# Patient Record
Sex: Female | Born: 1970 | Race: White | Hispanic: No | Marital: Married | Smoking: Never smoker
Health system: Southern US, Community
[De-identification: ages and names within clinical notes are randomized; demographics above are authoritative.]

## PROBLEM LIST (undated history)

## (undated) DIAGNOSIS — K635 Polyp of colon: Secondary | ICD-10-CM

## (undated) DIAGNOSIS — G8929 Other chronic pain: Secondary | ICD-10-CM

## (undated) DIAGNOSIS — K222 Esophageal obstruction: Secondary | ICD-10-CM

## (undated) DIAGNOSIS — M545 Low back pain, unspecified: Secondary | ICD-10-CM

## (undated) DIAGNOSIS — K219 Gastro-esophageal reflux disease without esophagitis: Secondary | ICD-10-CM

## (undated) DIAGNOSIS — N809 Endometriosis, unspecified: Secondary | ICD-10-CM

## (undated) DIAGNOSIS — R112 Nausea with vomiting, unspecified: Secondary | ICD-10-CM

## (undated) DIAGNOSIS — F419 Anxiety disorder, unspecified: Secondary | ICD-10-CM

## (undated) DIAGNOSIS — Z9889 Other specified postprocedural states: Secondary | ICD-10-CM

## (undated) HISTORY — PX: DIAGNOSTIC LAPAROSCOPY: SUR761

## (undated) HISTORY — DX: Esophageal obstruction: K22.2

## (undated) HISTORY — PX: DILATION AND CURETTAGE OF UTERUS: SHX78

## (undated) HISTORY — DX: Endometriosis, unspecified: N80.9

## (undated) HISTORY — DX: Polyp of colon: K63.5

## (undated) HISTORY — PX: APPENDECTOMY: SHX54

---

## 1997-07-20 ENCOUNTER — Ambulatory Visit (HOSPITAL_COMMUNITY): Admission: RE | Admit: 1997-07-20 | Discharge: 1997-07-20 | Payer: Self-pay | Admitting: Family Medicine

## 1997-07-21 ENCOUNTER — Ambulatory Visit (HOSPITAL_COMMUNITY): Admission: AD | Admit: 1997-07-21 | Discharge: 1997-07-21 | Payer: Self-pay | Admitting: Obstetrics and Gynecology

## 1997-10-27 ENCOUNTER — Other Ambulatory Visit: Admission: RE | Admit: 1997-10-27 | Discharge: 1997-10-27 | Payer: Self-pay | Admitting: Obstetrics and Gynecology

## 1998-05-18 ENCOUNTER — Ambulatory Visit: Admission: RE | Admit: 1998-05-18 | Discharge: 1998-05-18 | Payer: Self-pay | Admitting: Obstetrics and Gynecology

## 1998-06-28 ENCOUNTER — Inpatient Hospital Stay (HOSPITAL_COMMUNITY): Admission: AD | Admit: 1998-06-28 | Discharge: 1998-06-30 | Payer: Self-pay | Admitting: Obstetrics and Gynecology

## 1998-07-31 ENCOUNTER — Encounter (HOSPITAL_COMMUNITY): Admission: RE | Admit: 1998-07-31 | Discharge: 1998-10-29 | Payer: Self-pay | Admitting: Obstetrics and Gynecology

## 1998-08-01 ENCOUNTER — Other Ambulatory Visit: Admission: RE | Admit: 1998-08-01 | Discharge: 1998-08-01 | Payer: Self-pay | Admitting: Obstetrics & Gynecology

## 1999-06-21 ENCOUNTER — Other Ambulatory Visit: Admission: RE | Admit: 1999-06-21 | Discharge: 1999-06-21 | Payer: Self-pay | Admitting: Obstetrics and Gynecology

## 2000-08-19 ENCOUNTER — Other Ambulatory Visit: Admission: RE | Admit: 2000-08-19 | Discharge: 2000-08-19 | Payer: Self-pay | Admitting: Obstetrics and Gynecology

## 2001-07-30 ENCOUNTER — Inpatient Hospital Stay (HOSPITAL_COMMUNITY): Admission: AD | Admit: 2001-07-30 | Discharge: 2001-08-02 | Payer: Self-pay | Admitting: Obstetrics and Gynecology

## 2001-08-03 ENCOUNTER — Encounter: Admission: RE | Admit: 2001-08-03 | Discharge: 2001-09-02 | Payer: Self-pay | Admitting: Obstetrics and Gynecology

## 2001-09-08 ENCOUNTER — Other Ambulatory Visit: Admission: RE | Admit: 2001-09-08 | Discharge: 2001-09-08 | Payer: Self-pay | Admitting: Obstetrics & Gynecology

## 2001-10-03 ENCOUNTER — Encounter: Admission: RE | Admit: 2001-10-03 | Discharge: 2001-11-02 | Payer: Self-pay | Admitting: Obstetrics and Gynecology

## 2002-04-08 HISTORY — PX: BLADDER SUSPENSION: SHX72

## 2002-10-04 ENCOUNTER — Other Ambulatory Visit: Admission: RE | Admit: 2002-10-04 | Discharge: 2002-10-04 | Payer: Self-pay | Admitting: Obstetrics and Gynecology

## 2002-11-04 ENCOUNTER — Ambulatory Visit (HOSPITAL_BASED_OUTPATIENT_CLINIC_OR_DEPARTMENT_OTHER): Admission: RE | Admit: 2002-11-04 | Discharge: 2002-11-04 | Payer: Self-pay | Admitting: Urology

## 2003-08-18 ENCOUNTER — Other Ambulatory Visit: Admission: RE | Admit: 2003-08-18 | Discharge: 2003-08-18 | Payer: Self-pay | Admitting: Obstetrics and Gynecology

## 2004-08-21 ENCOUNTER — Other Ambulatory Visit: Admission: RE | Admit: 2004-08-21 | Discharge: 2004-08-21 | Payer: Self-pay | Admitting: Obstetrics and Gynecology

## 2010-04-08 HISTORY — PX: URETHRAL SLING: SHX2621

## 2010-10-09 ENCOUNTER — Other Ambulatory Visit (HOSPITAL_COMMUNITY): Payer: Self-pay | Admitting: Urology

## 2010-10-09 DIAGNOSIS — N361 Urethral diverticulum: Secondary | ICD-10-CM

## 2010-10-11 ENCOUNTER — Ambulatory Visit (HOSPITAL_COMMUNITY)
Admission: RE | Admit: 2010-10-11 | Discharge: 2010-10-11 | Disposition: A | Discharge status: Home / Self Care | Payer: PRIVATE HEALTH INSURANCE | Source: Ambulatory Visit | Attending: Urology | Admitting: Urology

## 2010-10-11 DIAGNOSIS — N361 Urethral diverticulum: Secondary | ICD-10-CM

## 2010-10-11 DIAGNOSIS — R3915 Urgency of urination: Secondary | ICD-10-CM | POA: Insufficient documentation

## 2010-10-11 DIAGNOSIS — N72 Inflammatory disease of cervix uteri: Secondary | ICD-10-CM | POA: Insufficient documentation

## 2010-10-11 DIAGNOSIS — N949 Unspecified condition associated with female genital organs and menstrual cycle: Secondary | ICD-10-CM | POA: Insufficient documentation

## 2010-10-11 DIAGNOSIS — N854 Malposition of uterus: Secondary | ICD-10-CM | POA: Insufficient documentation

## 2010-10-11 DIAGNOSIS — M5126 Other intervertebral disc displacement, lumbar region: Secondary | ICD-10-CM | POA: Insufficient documentation

## 2010-10-11 MED ORDER — GADOBENATE DIMEGLUMINE 529 MG/ML IV SOLN
14.0000 mL | Freq: Once | INTRAVENOUS | Status: AC | PRN
  Administered 2010-10-11: 14 mL via INTRAVENOUS

## 2010-12-21 ENCOUNTER — Ambulatory Visit (HOSPITAL_BASED_OUTPATIENT_CLINIC_OR_DEPARTMENT_OTHER)
Admission: RE | Admit: 2010-12-21 | Discharge: 2010-12-21 | Disposition: A | Discharge status: Home / Self Care | Payer: PRIVATE HEALTH INSURANCE | Source: Ambulatory Visit | Attending: Urology | Admitting: Urology

## 2010-12-21 ENCOUNTER — Other Ambulatory Visit: Payer: Self-pay | Admitting: Urology

## 2010-12-21 DIAGNOSIS — N393 Stress incontinence (female) (male): Secondary | ICD-10-CM | POA: Insufficient documentation

## 2010-12-21 DIAGNOSIS — Z01812 Encounter for preprocedural laboratory examination: Secondary | ICD-10-CM | POA: Insufficient documentation

## 2010-12-21 DIAGNOSIS — N368 Other specified disorders of urethra: Secondary | ICD-10-CM | POA: Insufficient documentation

## 2010-12-21 LAB — POCT PREGNANCY, URINE: Preg Test, Ur: NEGATIVE

## 2010-12-26 NOTE — Op Note (Signed)
NAMEJOSELY, MOFFAT                  ACCOUNT NO.:  [id number]  MEDICAL RECORD NO.:  [id number]  LOCATION:                                 FACILITY:  PHYSICIAN:  Talor Cheema I. Patsi Sears, M.D.DATE OF BIRTH:  July 08, 1970  DATE OF PROCEDURE: DATE OF DISCHARGE:                              OPERATIVE REPORT   PREOPERATIVE DIAGNOSIS:  Left periurethral mass.  POSTOPERATIVE DIAGNOSIS:  Left periurethral mass.  OPERATIONS:  Vaginal exploration, excision of left mid-periurethral mass, excision of apparent foreign body (sent to Pathology), implantation of Xenform, bovine, biologic tissue, cystoscopy.  SURGEON:  Eliyana Pagliaro I. Patsi Sears, M.D.  ANESTHESIA:  General LMA.  PREPARATION:  After appropriate preanesthesia, the patient was brought to the operating room, placed on the operating room in the dorsal supine position where general LMA anesthesia was introduced.  She was replaced in the dorsal lithotomy position where the pubis was prepped with Betadine solution and draped in the usual fashion.  Preoperatively, the patient received 1 g of IV Tylenol, as well as a scopolamine dot behind her right ear for nausea control postop.  REVIEW OF HISTORY:  This 40 year old female has a painful urethral mass thought to be possible diverticulum.  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  The patient denies urinary incontinence or intermittency. She does have some difficulty voiding.  She has video urodynamics, which shows the Valsalva leak point pressure of 36 cm of water, with moderate leakage.  The patient has bladder capacity of 338 cc.  She has positive stress incontinence, with some urethral hypermobility, and leakage of large amounts.  Methylene blue vesicovaginal fistula test was performed, which was negative.  She is now for removal of her mass.  PROCEDURE IN DETAIL:  As incision was made after vaginal inspection reveals a  left periurethral mass.  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 Incision was made in an upside down U fashion, to developed a flap of tissue.  Careful inspection was accomplished, with great care taken to avoid any injury to the urethra.  A complete dissection of the mass was accomplished, and evaluation proved to be fibrinous tissue consistent with old surgery for mid-urethral sling, although no mesh was identified.  This was completely removed.  Minimal bleeding was noted. Antibiotic irrigation was accomplished.  I then elected to place Xenform in the area, to protect the urethra.  Note that cystoscopy was accomplished with 12 degree and 70 degree lenses, and showed normal bladder and normal bladder neck with normal sphincter.  There was no evidence of bladder stone, tumor, or diverticular formation.  Clear efflux was seen from both orifices.  An indigo carmine was then given through the urethra, retrograde, and I did not see any indigo carmine in the wound.  Therefore, the Xenform was placed to protect urethra, and the tissue edges were freshened, and closed with running 2-0 Vicryl suture.  The patient tolerated the procedure well.  She was then awakened and taken to the Recovery in good condition.     Vetra Shinall I. Patsi Sears, M.D.  SIT/MEDQ  D:  12/21/2010  T:  12/22/2010  Job:  [id number]  cc:   Miguel Aschoff, M.D.  Electronically Signed by Jethro Bolus M.D. on 12/26/2010 11:37:08 AM

## 2011-12-05 ENCOUNTER — Other Ambulatory Visit: Payer: Self-pay | Admitting: Obstetrics and Gynecology

## 2011-12-05 ENCOUNTER — Encounter (HOSPITAL_COMMUNITY): Payer: Self-pay

## 2011-12-11 ENCOUNTER — Other Ambulatory Visit: Payer: Self-pay | Admitting: Obstetrics and Gynecology

## 2011-12-11 DIAGNOSIS — R928 Other abnormal and inconclusive findings on diagnostic imaging of breast: Secondary | ICD-10-CM

## 2011-12-13 ENCOUNTER — Ambulatory Visit
Admission: RE | Admit: 2011-12-13 | Discharge: 2011-12-13 | Disposition: A | Discharge status: Home / Self Care | Payer: BC Managed Care – PPO | Source: Ambulatory Visit | Attending: Obstetrics and Gynecology | Admitting: Obstetrics and Gynecology

## 2011-12-13 ENCOUNTER — Encounter (HOSPITAL_COMMUNITY)
Admission: RE | Admit: 2011-12-13 | Discharge: 2011-12-13 | Disposition: A | Discharge status: Home / Self Care | Payer: BC Managed Care – PPO | Source: Ambulatory Visit | Attending: Obstetrics and Gynecology | Admitting: Obstetrics and Gynecology

## 2011-12-13 ENCOUNTER — Encounter (HOSPITAL_COMMUNITY): Payer: Self-pay

## 2011-12-13 DIAGNOSIS — R928 Other abnormal and inconclusive findings on diagnostic imaging of breast: Secondary | ICD-10-CM

## 2011-12-13 HISTORY — DX: Other specified postprocedural states: Z98.890

## 2011-12-13 HISTORY — DX: Anxiety disorder, unspecified: F41.9

## 2011-12-13 HISTORY — DX: Nausea with vomiting, unspecified: R11.2

## 2011-12-13 HISTORY — DX: Gastro-esophageal reflux disease without esophagitis: K21.9

## 2011-12-13 LAB — SURGICAL PCR SCREEN: Staphylococcus aureus: POSITIVE — AB

## 2011-12-13 LAB — CBC
HCT: 39.3 % (ref 36.0–46.0)
MCHC: 33.6 g/dL (ref 30.0–36.0)
MCV: 93.6 fL (ref 78.0–100.0)
Platelets: 202 10*3/uL (ref 150–400)
RDW: 12.6 % (ref 11.5–15.5)
WBC: 9.6 10*3/uL (ref 4.0–10.5)

## 2011-12-13 NOTE — Patient Instructions (Signed)
Your procedure is scheduled on:12/19/11  Enter through the Main Entrance at : 1030 am Pick up desk phone and dial [phone] and inform us of your arrival.  Please call [phone] if you have any problems the morning of surgery.  Remember: Do not eat after midnight:Wed Do not drink after:water ok until 8am Thursday  Take these meds the morning of surgery with a sip of water:Zantac  DO NOT wear jewelry, eye make-up, lipstick,body lotion, or dark fingernail polish. Do not shave for 48 hours prior to surgery.  If you are to be admitted after surgery, leave suitcase in car until your room has been assigned. Patients discharged on the day of surgery will not be allowed to drive home.   Remember to use your Hibiclens as instructed.

## 2011-12-18 NOTE — H&P (Signed)
Robin Parks is an 41 y.o. female. With a history of chronic pelvic pain and dyspareunia that has not responded to medical therapy and thus far an etiology as to causation has not been found. Recent evaluation  with ultrasound was non revealing as to cause with the ultrasound on 11/13/2011 being negative. The pain has been non cyclic and has not responded to medical therapy. She is now unable to sexual intercourse because of the degree of pain. She is now being taken to the operating room for diagnostic laparoscopy to see if an etiology of the pain can be established and corrected. She is not yet ready for more definitve therapy including hysterectomy.  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 PONV (postoperative nausea and vomiting)   . Anxiety     PANIC ATTACKS  . GERD (gastroesophageal reflux disease)     Past Surgical History  Procedure Date  . Bladder suspension 2004  . Urethral sling 2012    SLING MATERIAL REMOVED  . Diagnostic laparoscopy   . Dilation and curettage of uterus   . Appendectomy     No family history on file.  Social History:  reports that she has never smoked. She does not have any smokeless tobacco history on file. She reports that she drinks alcohol. She reports that she does not use illicit drugs.  Allergies:  Allergies  Allergen Reactions  . Codeine     Panic attacks, heart palpitations  . Oxycodone     Panic attacks, heart palpitations    No prescriptions prior to admission     ROS  Respiratory: no cough, dyspnea or SOB GI: no nausea, vomiting or diarrhea. No  melena or hematochezia GU: Prior USI, urethral diverticulum but no current dysuria, frequency, or urgency Gyn: Cycles are regular and pain is non cyclic. No discharge or itch   There were no vitals taken for this visit. Physical Exam  Afebrile  BP  127/64   Pulse  82  Head: normocephalic and atraumatic Neck: supple with no JVD and no thyromegaly Chest: clear to P&A Heart: regular rhythm no murmur or gallop Abdomen: Soft without enlargement of the liver kidneys or spleen Pelvic Exam:                       External genitalia WNL                      BUS: WNL                         Vagina: normal discharge no lesions seen                       Cervix: without gross lesion. Tender to touch and motion                        Uterus; normal size and shape tender to touch and motion  Adnexa: without masses but tender  Neurologic exam: grossly within normal limits  No results found for this or any previous visit (from the past 24 hour(s)).  No results found.  Assessment/Plan:  Chronic pelvic pain and dyspareunia not responsive to medical therapy  Plan:  Diagnostic laparoscopy  Risk and benefits have been discussed and all questions posed have been anwered  [facility] 12/18/2011, 6:49 PM

## 2011-12-19 ENCOUNTER — Encounter (HOSPITAL_COMMUNITY): Payer: Self-pay | Admitting: Anesthesiology

## 2011-12-19 ENCOUNTER — Ambulatory Visit (HOSPITAL_COMMUNITY): Payer: BC Managed Care – PPO | Admitting: Anesthesiology

## 2011-12-19 ENCOUNTER — Encounter (HOSPITAL_COMMUNITY): Payer: Self-pay | Admitting: *Deleted

## 2011-12-19 ENCOUNTER — Encounter (HOSPITAL_COMMUNITY)
Admission: RE | Disposition: A | Discharge status: Home / Self Care | Payer: Self-pay | Source: Ambulatory Visit | Attending: Obstetrics and Gynecology

## 2011-12-19 ENCOUNTER — Other Ambulatory Visit: Payer: Self-pay | Admitting: Obstetrics and Gynecology

## 2011-12-19 ENCOUNTER — Ambulatory Visit (HOSPITAL_COMMUNITY)
Admission: RE | Admit: 2011-12-19 | Discharge: 2011-12-19 | Disposition: A | Discharge status: Home / Self Care | Payer: BC Managed Care – PPO | Source: Ambulatory Visit | Attending: Obstetrics and Gynecology | Admitting: Obstetrics and Gynecology

## 2011-12-19 DIAGNOSIS — N803 Endometriosis of pelvic peritoneum, unspecified: Secondary | ICD-10-CM | POA: Insufficient documentation

## 2011-12-19 DIAGNOSIS — N949 Unspecified condition associated with female genital organs and menstrual cycle: Secondary | ICD-10-CM | POA: Insufficient documentation

## 2011-12-19 DIAGNOSIS — Z01812 Encounter for preprocedural laboratory examination: Secondary | ICD-10-CM | POA: Insufficient documentation

## 2011-12-19 DIAGNOSIS — IMO0002 Reserved for concepts with insufficient information to code with codable children: Secondary | ICD-10-CM | POA: Insufficient documentation

## 2011-12-19 DIAGNOSIS — Z01818 Encounter for other preprocedural examination: Secondary | ICD-10-CM | POA: Insufficient documentation

## 2011-12-19 HISTORY — PX: LAPAROSCOPY: SHX197

## 2011-12-19 SURGERY — LAPAROSCOPY OPERATIVE
Anesthesia: General | Site: Abdomen | Wound class: Clean Contaminated

## 2011-12-19 MED ORDER — FENTANYL CITRATE 0.05 MG/ML IJ SOLN
INTRAMUSCULAR | Status: AC
  Filled 2011-12-19: qty 5

## 2011-12-19 MED ORDER — FENTANYL CITRATE 0.05 MG/ML IJ SOLN
INTRAMUSCULAR | Status: AC
  Administered 2011-12-19: 25 ug via INTRAVENOUS
  Filled 2011-12-19: qty 2

## 2011-12-19 MED ORDER — BUPIVACAINE HCL (PF) 0.25 % IJ SOLN
INTRAMUSCULAR | Status: AC
  Filled 2011-12-19: qty 30

## 2011-12-19 MED ORDER — ONDANSETRON HCL 4 MG/2ML IJ SOLN
INTRAMUSCULAR | Status: DC | PRN
  Administered 2011-12-19: 4 mg via INTRAVENOUS

## 2011-12-19 MED ORDER — DEXAMETHASONE SODIUM PHOSPHATE 4 MG/ML IJ SOLN: INTRAMUSCULAR | Status: DC | PRN

## 2011-12-19 MED ORDER — LIDOCAINE HCL (CARDIAC) 20 MG/ML IV SOLN
INTRAVENOUS | Status: DC | PRN
  Administered 2011-12-19: 50 mg via INTRAVENOUS

## 2011-12-19 MED ORDER — FENTANYL CITRATE 0.05 MG/ML IJ SOLN
25.0000 ug | INTRAMUSCULAR | Status: DC | PRN
  Administered 2011-12-19: 25 ug via INTRAVENOUS

## 2011-12-19 MED ORDER — SCOPOLAMINE 1 MG/3DAYS TD PT72
MEDICATED_PATCH | TRANSDERMAL | Status: AC
  Administered 2011-12-19: 1.5 mg via TRANSDERMAL
  Filled 2011-12-19: qty 1

## 2011-12-19 MED ORDER — ONDANSETRON HCL 4 MG/2ML IJ SOLN: 4.0000 mg | Freq: Once | INTRAMUSCULAR | Status: DC | PRN

## 2011-12-19 MED ORDER — NEOSTIGMINE METHYLSULFATE 1 MG/ML IJ SOLN
INTRAMUSCULAR | Status: AC
  Filled 2011-12-19: qty 10

## 2011-12-19 MED ORDER — GLYCOPYRROLATE 0.2 MG/ML IJ SOLN
INTRAMUSCULAR | Status: AC
  Filled 2011-12-19: qty 2

## 2011-12-19 MED ORDER — SCOPOLAMINE 1 MG/3DAYS TD PT72
1.0000 | MEDICATED_PATCH | Freq: Once | TRANSDERMAL | Status: DC
  Administered 2011-12-19: 1.5 mg via TRANSDERMAL

## 2011-12-19 MED ORDER — KETOROLAC TROMETHAMINE 30 MG/ML IJ SOLN
INTRAMUSCULAR | Status: DC | PRN
  Administered 2011-12-19: 30 mg via INTRAVENOUS

## 2011-12-19 MED ORDER — MIDAZOLAM HCL 5 MG/5ML IJ SOLN
INTRAMUSCULAR | Status: DC | PRN
  Administered 2011-12-19: 2 mg via INTRAVENOUS

## 2011-12-19 MED ORDER — NEOSTIGMINE METHYLSULFATE 1 MG/ML IJ SOLN
INTRAMUSCULAR | Status: DC | PRN
  Administered 2011-12-19: 3 mg via INTRAVENOUS

## 2011-12-19 MED ORDER — LACTATED RINGERS IV SOLN
INTRAVENOUS | Status: DC
  Administered 2011-12-19 (×2): via INTRAVENOUS

## 2011-12-19 MED ORDER — ROCURONIUM BROMIDE 50 MG/5ML IV SOLN
INTRAVENOUS | Status: AC
  Filled 2011-12-19: qty 1

## 2011-12-19 MED ORDER — BUPIVACAINE HCL (PF) 0.25 % IJ SOLN
INTRAMUSCULAR | Status: DC | PRN
  Administered 2011-12-19: 10 mL

## 2011-12-19 MED ORDER — PROPOFOL 10 MG/ML IV EMUL
INTRAVENOUS | Status: DC | PRN
  Administered 2011-12-19: 200 mg via INTRAVENOUS

## 2011-12-19 MED ORDER — FENTANYL CITRATE 0.05 MG/ML IJ SOLN
INTRAMUSCULAR | Status: DC | PRN
  Administered 2011-12-19: 150 ug via INTRAVENOUS
  Administered 2011-12-19: 100 ug via INTRAVENOUS

## 2011-12-19 MED ORDER — DEXAMETHASONE SODIUM PHOSPHATE 10 MG/ML IJ SOLN
INTRAMUSCULAR | Status: AC
  Filled 2011-12-19: qty 1

## 2011-12-19 MED ORDER — MIDAZOLAM HCL 2 MG/2ML IJ SOLN
INTRAMUSCULAR | Status: AC
  Filled 2011-12-19: qty 2

## 2011-12-19 MED ORDER — ONDANSETRON HCL 4 MG/2ML IJ SOLN
INTRAMUSCULAR | Status: AC
  Filled 2011-12-19: qty 2

## 2011-12-19 MED ORDER — PROPOFOL 10 MG/ML IV EMUL
INTRAVENOUS | Status: AC
  Filled 2011-12-19: qty 20

## 2011-12-19 MED ORDER — DEXAMETHASONE SODIUM PHOSPHATE 4 MG/ML IJ SOLN
INTRAMUSCULAR | Status: DC | PRN
  Administered 2011-12-19: 10 mg via INTRAVENOUS

## 2011-12-19 MED ORDER — KETOROLAC TROMETHAMINE 30 MG/ML IJ SOLN: 15.0000 mg | Freq: Once | INTRAMUSCULAR | Status: DC | PRN

## 2011-12-19 MED ORDER — LIDOCAINE HCL (CARDIAC) 20 MG/ML IV SOLN
INTRAVENOUS | Status: AC
  Filled 2011-12-19: qty 5

## 2011-12-19 MED ORDER — GLYCOPYRROLATE 0.2 MG/ML IJ SOLN
INTRAMUSCULAR | Status: DC | PRN
  Administered 2011-12-19: 0.4 mg via INTRAVENOUS
  Administered 2011-12-19: 0.2 mg via INTRAVENOUS

## 2011-12-19 MED ORDER — KETOROLAC TROMETHAMINE 30 MG/ML IJ SOLN
INTRAMUSCULAR | Status: AC
  Filled 2011-12-19: qty 1

## 2011-12-19 MED ORDER — MEPERIDINE HCL 25 MG/ML IJ SOLN: 6.2500 mg | INTRAMUSCULAR | Status: DC | PRN

## 2011-12-19 MED ORDER — ROCURONIUM BROMIDE 100 MG/10ML IV SOLN
INTRAVENOUS | Status: DC | PRN
  Administered 2011-12-19: 30 mg via INTRAVENOUS

## 2011-12-19 SURGICAL SUPPLY — 19 items
BLADE SURG 15 STRL LF C SS BP (BLADE) ×1 IMPLANT
BLADE SURG 15 STRL SS (BLADE) ×2
CABLE HIGH FREQUENCY MONO STRZ (ELECTRODE) ×1 IMPLANT
CATH ROBINSON RED A/P 16FR (CATHETERS) ×2 IMPLANT
CLOTH BEACON ORANGE TIMEOUT ST (SAFETY) ×2 IMPLANT
ELECT REM PT RETURN 9FT ADLT (ELECTROSURGICAL) ×2
ELECTRODE REM PT RTRN 9FT ADLT (ELECTROSURGICAL) IMPLANT
GLOVE BIO SURGEON STRL SZ7.5 (GLOVE) ×4 IMPLANT
GOWN PREVENTION PLUS LG XLONG (DISPOSABLE) ×3 IMPLANT
GOWN PREVENTION PLUS XXLARGE (GOWN DISPOSABLE) ×2 IMPLANT
NS IRRIG 1000ML POUR BTL (IV SOLUTION) ×2 IMPLANT
PACK LAPAROSCOPY BASIN (CUSTOM PROCEDURE TRAY) ×2 IMPLANT
PROTECTOR NERVE ULNAR (MISCELLANEOUS) ×3 IMPLANT
RINGERS IRRIG 1000ML POUR BTL (IV SOLUTION) IMPLANT
STRIP CLOSURE SKIN 1/4X3 (GAUZE/BANDAGES/DRESSINGS) ×1 IMPLANT
SUT VICRYL 4-0 PS2 18IN ABS (SUTURE) ×2 IMPLANT
TOWEL OR 17X24 6PK STRL BLUE (TOWEL DISPOSABLE) ×4 IMPLANT
WARMER LAPAROSCOPE (MISCELLANEOUS) ×2 IMPLANT
WATER STERILE IRR 1000ML POUR (IV SOLUTION) ×2 IMPLANT

## 2011-12-19 NOTE — H&P (Signed)
Status unchanged will proceed with laparoscopy to assess etiology of pain.

## 2011-12-19 NOTE — Transfer of Care (Signed)
Immediate Anesthesia Transfer of Care Note  Patient: Robin Parks  Procedure(s) Performed: Procedure(s) (LRB) with comments: LAPAROSCOPY OPERATIVE (N/A) LYSIS OF ADHESION (N/A) ABLATION ON ENDOMETRIOSIS (N/A)  Patient Location: PACU  Anesthesia Type: General  Level of Consciousness: awake, alert  and oriented  Airway & Oxygen Therapy: Patient Spontanous Breathing and Patient connected to nasal cannula oxygen  Post-op Assessment: Report given to PACU RN and Post -op Vital signs reviewed and stable  Post vital signs: Reviewed and stable  Complications: No apparent anesthesia complications

## 2011-12-19 NOTE — Anesthesia Postprocedure Evaluation (Signed)
Anesthesia Post Note  Patient: Robin Parks  Procedure(s) Performed: Procedure(s) (LRB): LAPAROSCOPY OPERATIVE (N/A) LYSIS OF ADHESION (N/A) ABLATION ON ENDOMETRIOSIS (N/A)  Anesthesia type: General  Patient location: PACU  Post pain: Pain level controlled  Post assessment: Post-op Vital signs reviewed  Last Vitals:  Filed Vitals:   12/19/11 1300  BP: 109/57  Pulse: 74  Temp:   Resp: 12    Post vital signs: Reviewed  Level of consciousness: sedated  Complications: No apparent anesthesia complicationsfj

## 2011-12-19 NOTE — Anesthesia Preprocedure Evaluation (Signed)
Anesthesia Evaluation  Patient identified by MRN, date of birth, ID band Patient awake    Reviewed: Allergy & Precautions, H&P , NPO status , Patient's Chart, lab work & pertinent test results  History of Anesthesia Complications (+) PONV  Airway Mallampati: I TM Distance: >3 FB Neck ROM: full    Dental No notable dental hx. (+) Teeth Intact   Pulmonary neg pulmonary ROS,    Pulmonary exam normal       Cardiovascular negative cardio ROS      Neuro/Psych PSYCHIATRIC DISORDERS Anxiety negative neurological ROS     GI/Hepatic Neg liver ROS, GERD-  Medicated and Controlled,  Endo/Other  negative endocrine ROS  Renal/GU negative Renal ROS  negative genitourinary   Musculoskeletal negative musculoskeletal ROS (+)   Abdominal Normal abdominal exam  (+)   Peds negative pediatric ROS (+)  Hematology negative hematology ROS (+)   Anesthesia Other Findings   Reproductive/Obstetrics negative OB ROS                           Anesthesia Physical Anesthesia Plan  ASA: II  Anesthesia Plan: General   Post-op Pain Management:    Induction: Intravenous  Airway Management Planned: Oral ETT  Additional Equipment:   Intra-op Plan:   Post-operative Plan: Extubation in OR  Informed Consent: I have reviewed the patients History and Physical, chart, labs and discussed the procedure including the risks, benefits and alternatives for the proposed anesthesia with the patient or authorized representative who has indicated his/her understanding and acceptance.   Dental Advisory Given  Plan Discussed with: CRNA and Surgeon  Anesthesia Plan Comments:         Anesthesia Quick Evaluation

## 2011-12-19 NOTE — Brief Op Note (Signed)
12/19/2011  12:48 PM  PATIENT:  Robin Parks  41 y.o. female  PRE-OPERATIVE DIAGNOSIS:  Pelvic Pain  POST-OPERATIVE DIAGNOSIS:  Adhesions; Endometriosis  PROCEDURE:  Procedure(s) (LRB) with comments: LAPAROSCOPY OPERATIVE (N/A) LYSIS OF ADHESION (N/A) ABLATION ON ENDOMETRIOSIS (N/A)  SURGEON:  Surgeon(s) and Role:    * Miguel Aschoff, MD - Primary   ANESTHESIA:   general  EBL:  Total I/O In: 1400 [I.V.:1400] Out: -   BLOOD ADMINISTERED:none  DRAINS: none   LOCAL MEDICATIONS USED:  MARCAINE     SPECIMEN:  No Specimen  DISPOSITION OF SPECIMEN:  N/A  COUNTS:  YES  TOURNIQUET:  * No tourniquets in log *  DICTATION: .Other Dictation: Dictation Number [id number]  PLAN OF CARE: Discharge to home after PACU  PATIENT DISPOSITION:  PACU - hemodynamically stable.   Delay start of Pharmacological VTE agent (>24hrs) due to surgical blood loss or risk of bleeding: not applicable

## 2011-12-20 ENCOUNTER — Encounter (HOSPITAL_COMMUNITY): Payer: Self-pay | Admitting: Obstetrics and Gynecology

## 2011-12-20 NOTE — Op Note (Signed)
NAMEDARRELLE, BARRELL NO.:  [id number]  MEDICAL RECORD NO.:  [id number]  LOCATION:  [facility]                          FACILITY:  [facility]  PHYSICIAN:  Miguel Aschoff, M.D.       DATE OF BIRTH:  04/27/1970  DATE OF PROCEDURE: DATE OF DISCHARGE:  12/19/2011                              OPERATIVE REPORT   PREOPERATIVE DIAGNOSIS:  Chronic pelvic pain.  POSTOPERATIVE DIAGNOSES:  Pelvic adhesions.  Pelvic endometriosis.  PROCEDURE:  Diagnostic laparoscopy, lysis of adhesions, fulguration of endometriosis.  SURGEON:  Miguel Aschoff, M.D.  ANESTHESIA:  General.  COMPLICATIONS:  None.  JUSTIFICATION:  The patient is a 41 year old white female with history of persistent pelvic pain, dyspareunia that has been getting worse.  The patient at this point has undergone an outpatient evaluation with ultrasound, which was essentially nonrevealing as the etiology of the pain.  Because of her symptomatology, she presents now to undergo laparoscopy to see if an etiology of the pain could be established and corrected.  The risks and benefits of the procedure were discussed with the patient and informed consent has been obtained.  PROCEDURE:  The patient was taken to the operating room, placed in the supine position.  General anesthesia was administered without difficulty.  She was then placed in dorsal lithotomy position, prepped in the usual sterile fashion.  Bladder was catheterized.  Once this was done, examination under anesthesia was carried out.  This revealed normal external genitalia, normal Bartholin and Skene glands.  Normal urethra.  The vaginal vault was without gross lesion.  The cervix was without gross lesion.  The uterus was noted to be anterior and normal size and shape.  No adnexal masses were noted.  At this point, speculum was placed in the vaginal vault.  The anterior cervical lip was grasped with a tenaculum and then the Hulka tenaculum was placed and held.   The speculum was removed.  Attention was then directed to the umbilicus where a small infraumbilical incision was made.  A Veress needle was inserted and then the abdomen was insufflated with 3 liters of CO2. Following the insufflation, trocar to laparoscope was placed followed by laparoscope itself.  Once confirmed in the abdominal cavity under direct visualization, a second puncture was established under direct visualization in the right lower quadrant using a 5-mm port.  At this point, systematic inspection revealed the anterior bladder peritoneum to be unremarkable.  The round ligaments were visualized were noted to be unremarkable.  The right tube was visualized and was normal along its course.  The right ovary, however, was noted to be adherent to the lateral pelvic sidewall with three bands of firm adhesions when implant of endometriosis was noted in the area where the ovary was adhesed to the lateral pelvic sidewall.  In the cul-de-sac, the uterosacral ligament showed some scarring, but no definite pigmented lesions were seen on the uterosacral ligaments.  On the left side, the left tube was within normal limits.  The left ovary was noted to have multiple cyst. At this point, the operative scope was placed through the umbilical port and using operating scissors, it was possible to elevate the  ovary, place the adhesions on stretch and cut these down without difficulty with good hemostasis, thus freeing the ovary from the lateral pelvic sidewall.  Once this was done, the implants of endometriosis were fulgurated and then the right uterosacral ligament was fulgurated to try to denervate the uterus partially to see if this would improve the patient's pain.  This was done with care to avoid any injury to the ureter or any vessels.  Once this was done and the ovary freed and turned to a normal configuration, inspection of the remainder of the abdomen was carried out.  The patient did  have prior appendectomy. There was no significant scarring noted in the right lower quadrant. Inspection of the gallbladder showed it to be normal.  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  With no other abnormalities being found with the anatomy restored to normal configuration, it was elected to complete the procedure.  The CO2 was allowed to escape.  All instruments were removed.  At this point, the small incisions were closed using subcuticular 4-0 Vicryl and then Steri- Strips were applied.  The port sites were then injected with a total of 10 mL of 0.25% Marcaine.  The patient was reversed from the anesthetic and brought to the recovery room in satisfactory condition.  The estimated blood loss from the procedure was minimal.  Plan is for the patient to be discharged home.  She will be seen back in 4 weeks for followup examination.  She is to call on December 20, 2011, to discuss the operative findings.     Miguel Aschoff, M.D.     AR/MEDQ  D:  12/19/2011  T:  12/20/2011  Job:  [id number]

## 2012-02-21 ENCOUNTER — Other Ambulatory Visit: Payer: Self-pay | Admitting: Obstetrics and Gynecology

## 2012-02-27 ENCOUNTER — Encounter (HOSPITAL_COMMUNITY)
Admission: RE | Admit: 2012-02-27 | Discharge: 2012-02-27 | Disposition: A | Discharge status: Home / Self Care | Payer: BC Managed Care – PPO | Source: Ambulatory Visit | Attending: Obstetrics and Gynecology | Admitting: Obstetrics and Gynecology

## 2012-02-27 ENCOUNTER — Encounter (HOSPITAL_COMMUNITY): Payer: Self-pay

## 2012-02-27 HISTORY — DX: Low back pain: M54.5

## 2012-02-27 HISTORY — DX: Other chronic pain: G89.29

## 2012-02-27 HISTORY — DX: Low back pain, unspecified: M54.50

## 2012-02-27 LAB — SURGICAL PCR SCREEN
MRSA, PCR: NEGATIVE
Staphylococcus aureus: POSITIVE — AB

## 2012-02-27 LAB — CBC
MCHC: 33.9 g/dL (ref 30.0–36.0)
RDW: 12.1 % (ref 11.5–15.5)

## 2012-02-27 MED ORDER — CEFAZOLIN SODIUM-DEXTROSE 2-3 GM-% IV SOLR: 2.0000 g | INTRAVENOUS | Status: AC

## 2012-02-27 NOTE — Patient Instructions (Addendum)
   Your procedure is scheduled on:  Tuesday, Nov 26th at 0730 am  Enter through the Main Entrance of [facility] at:  6 am Pick up the phone at the desk and dial [phone] and inform us of your arrival.  Please call this number if you have any problems the morning of surgery: [phone]  Remember: Do not eat food after midnight: Monday Do not drink clear liquids after: Monday Take these medicines the morning of surgery with a SIP OF WATER: none  Do not wear jewelry, make-up, or FINGER nail polish No metal in your hair or on your body. Do not wear lotions, powders, perfumes. You may wear deodorant.  Please use your CHG wash as directed prior to surgery.  Do not shave anywhere for at least 12 hours prior to first CHG shower.  Do not bring valuables to the hospital. Contacts, dentures or bridgework may not be worn into surgery.  Leave suitcase in the car. After Surgery it may be brought to your room. For patients being admitted to the hospital, checkout time is 11:00am the day of discharge.  Home with husband Dulce Sellar.

## 2012-03-09 NOTE — H&P (Signed)
Robin Parks is an 41 y.o. female. G5 Z6109 who presents with a history of chronic pelvic pain and dyspareunia. She underwent laparoscopy in September 2013 at which time she was found to have pelvic endometriosis and adhesions. The adhesions were treated laparoscopically and the endometriosis was fulgurated but the pain has not responded and at this point she wants definitve therapy to eliminate the persistent pain. She presents now to undergo hysterectomy and bilateral salpingo oophorectomy in an effort once and for all to resolve the pelvic pain issues.  Pertinent Gynecological History: Menses: flow is moderate Contraception: rhythm method DES exposure: denies Blood transfusions: none Sexually transmitted diseases: no past history Previous GYN Procedures: laparoscopy, bladder neck suspension and sling. resection of urethral diverticulum  Last mammogram: normal Date: 12/13/2011 Last pap: abnormal: CIN 1 Date: 11/30/2011  Menstrual History: Menarche age: 1 No LMP recorded.    Past Medical History  Diagnosis Date  . PONV (postoperative nausea and vomiting)   . Anxiety     PANIC ATTACKS  . GERD (gastroesophageal reflux disease)   . Chronic lower back pain     no meds  . SVD (spontaneous vaginal delivery)     x 2    Past Surgical History  Procedure Date  . Bladder suspension 2004  . Urethral sling 2012    SLING MATERIAL REMOVED  . Diagnostic laparoscopy   . Dilation and curettage of uterus   . Appendectomy   . Laparoscopy 12/19/2011    Procedure: LAPAROSCOPY OPERATIVE;  Surgeon: Miguel Aschoff, MD;  Location: [facility];  Service: Gynecology;  Laterality: N/A;    No family history on file.  Social History:  reports that she has never smoked. She has never used smokeless tobacco. She reports that she drinks about 4.2 ounces of alcohol per week. She reports that she does not use illicit drugs.  Allergies:  Allergies  Allergen Reactions  . Codeine Shortness Of Breath    Panic attacks,  heart palpitations  . Oxycodone Shortness Of Breath    Panic attacks, heart palpitations Vicodin OK    No prescriptions prior to admission    ROS  Height 5\' 5"  (1.651 m), weight 168 lb (76.204 kg). Physical Exam  Head: Normocephalic and atraumatic Eyes: PERRLA sclera non icteric Neck: Supple, no JVD no thyromegaly Heart; regular rhythm no murmur or gallop Abdomen: soft without enlargement of the liver kidneys or spleen. Bowel sounds active non tender with no rebound or guarding Back: no CVA tenderness  Pelvic Exam:   External genitalia: within normal limits   BUS: non tender, no masses noted   Vagina: without lesion, normal discharge   Cervix: without lesion but tender to touch and motion   Uterus: normal size, anterior, tender to touch and motion   Adnexa: without masses tender on right but no masses felt   No results found for this or any previous visit (from the past 24 hour(s)).  No results found.  Impression; Chronic pelvic pain, dyspareunia, pelvic endometriosis  Plan: Will proceed with hysterectomy and bilateral salpingo oophroectomy: Will assess under anesthesia to see if this can been done with laparoscopic assistance if not will proceed with abdominal hysterectomy.  Risks and benefits have been discussed and the patient understands that removal of her ovaries will lead to surgical menopause and need for hormone replacement therapy. She also understands the risks of infection, hemorrhage, blood clot formation and injury to adjacent structures. Al questions answered on pre operative visit.   Subhan Hoopes 03/09/2012, 7:02 PM

## 2012-03-10 ENCOUNTER — Inpatient Hospital Stay (HOSPITAL_COMMUNITY): Payer: BC Managed Care – PPO | Admitting: Anesthesiology

## 2012-03-10 ENCOUNTER — Encounter (HOSPITAL_COMMUNITY): Payer: Self-pay | Admitting: Anesthesiology

## 2012-03-10 ENCOUNTER — Encounter (HOSPITAL_COMMUNITY): Payer: Self-pay | Admitting: *Deleted

## 2012-03-10 ENCOUNTER — Encounter (HOSPITAL_COMMUNITY)
Admission: RE | Disposition: A | Discharge status: Home / Self Care | Payer: Self-pay | Source: Ambulatory Visit | Attending: Obstetrics and Gynecology

## 2012-03-10 ENCOUNTER — Observation Stay (HOSPITAL_COMMUNITY)
Admission: RE | Admit: 2012-03-10 | Discharge: 2012-03-12 | Disposition: A | Discharge status: Home / Self Care | Payer: BC Managed Care – PPO | Source: Ambulatory Visit | Attending: Obstetrics and Gynecology | Admitting: Obstetrics and Gynecology

## 2012-03-10 DIAGNOSIS — N870 Mild cervical dysplasia: Secondary | ICD-10-CM | POA: Insufficient documentation

## 2012-03-10 DIAGNOSIS — N831 Corpus luteum cyst of ovary, unspecified side: Secondary | ICD-10-CM | POA: Insufficient documentation

## 2012-03-10 DIAGNOSIS — IMO0002 Reserved for concepts with insufficient information to code with codable children: Principal | ICD-10-CM | POA: Insufficient documentation

## 2012-03-10 DIAGNOSIS — N809 Endometriosis, unspecified: Secondary | ICD-10-CM

## 2012-03-10 DIAGNOSIS — N803 Endometriosis of pelvic peritoneum, unspecified: Secondary | ICD-10-CM | POA: Insufficient documentation

## 2012-03-10 DIAGNOSIS — N8300 Follicular cyst of ovary, unspecified side: Secondary | ICD-10-CM | POA: Insufficient documentation

## 2012-03-10 DIAGNOSIS — N949 Unspecified condition associated with female genital organs and menstrual cycle: Secondary | ICD-10-CM | POA: Insufficient documentation

## 2012-03-10 HISTORY — PX: SALPINGOOPHORECTOMY: SHX82

## 2012-03-10 HISTORY — PX: LAPAROSCOPIC ASSISTED VAGINAL HYSTERECTOMY: SHX5398

## 2012-03-10 LAB — COMPREHENSIVE METABOLIC PANEL WITH GFR
ALT: 28 U/L (ref 0–35)
AST: 20 U/L (ref 0–37)
Albumin: 4.2 g/dL (ref 3.5–5.2)
Alkaline Phosphatase: 80 U/L (ref 39–117)
BUN: 7 mg/dL (ref 6–23)
CO2: 27 meq/L (ref 19–32)
Calcium: 9.9 mg/dL (ref 8.4–10.5)
Chloride: 100 meq/L (ref 96–112)
Creatinine, Ser: 0.64 mg/dL (ref 0.50–1.10)
GFR calc Af Amer: >90 mL/min
GFR calc non Af Amer: >90 mL/min
Glucose, Bld: 90 mg/dL (ref 70–99)
Potassium: 4.3 meq/L (ref 3.5–5.1)
Sodium: 137 meq/L (ref 135–145)
Total Bilirubin: 0.7 mg/dL (ref 0.3–1.2)
Total Protein: 7.8 g/dL (ref 6.0–8.3)

## 2012-03-10 LAB — APTT: aPTT: 29 s (ref 24–37)

## 2012-03-10 LAB — PROTIME-INR
INR: 0.94 (ref 0.00–1.49)
Prothrombin Time: 12.5 s (ref 11.6–15.2)

## 2012-03-10 LAB — PREGNANCY, URINE: Preg Test, Ur: NEGATIVE

## 2012-03-10 SURGERY — HYSTERECTOMY, VAGINAL, LAPAROSCOPY-ASSISTED
Anesthesia: General | Site: Abdomen | Wound class: Clean Contaminated

## 2012-03-10 MED ORDER — LIDOCAINE HCL (CARDIAC) 20 MG/ML IV SOLN
INTRAVENOUS | Status: AC
  Filled 2012-03-10: qty 5

## 2012-03-10 MED ORDER — INDIGOTINDISULFONATE SODIUM 8 MG/ML IJ SOLN
INTRAMUSCULAR | Status: DC | PRN
  Administered 2012-03-10: 5 mL via INTRAVENOUS

## 2012-03-10 MED ORDER — SODIUM CHLORIDE 0.9 % IJ SOLN: 9.0000 mL | INTRAMUSCULAR | Status: DC | PRN

## 2012-03-10 MED ORDER — FENTANYL CITRATE 0.05 MG/ML IJ SOLN
INTRAMUSCULAR | Status: AC
  Filled 2012-03-10: qty 2

## 2012-03-10 MED ORDER — KETOROLAC TROMETHAMINE 30 MG/ML IJ SOLN
15.0000 mg | Freq: Once | INTRAMUSCULAR | Status: AC | PRN
  Administered 2012-03-10: 30 mg via INTRAVENOUS

## 2012-03-10 MED ORDER — DIPHENHYDRAMINE HCL 12.5 MG/5ML PO ELIX: 12.5000 mg | ORAL_SOLUTION | Freq: Four times a day (QID) | ORAL | Status: DC | PRN

## 2012-03-10 MED ORDER — ROCURONIUM BROMIDE 100 MG/10ML IV SOLN
INTRAVENOUS | Status: DC | PRN
  Administered 2012-03-10: 50 mg via INTRAVENOUS
  Administered 2012-03-10: 10 mg via INTRAVENOUS

## 2012-03-10 MED ORDER — FENTANYL CITRATE 0.05 MG/ML IJ SOLN
INTRAMUSCULAR | Status: DC | PRN
  Administered 2012-03-10 (×2): 100 ug via INTRAVENOUS
  Administered 2012-03-10: 50 ug via INTRAVENOUS
  Administered 2012-03-10: 100 ug via INTRAVENOUS

## 2012-03-10 MED ORDER — GLYCOPYRROLATE 0.2 MG/ML IJ SOLN
INTRAMUSCULAR | Status: AC
  Filled 2012-03-10: qty 1

## 2012-03-10 MED ORDER — DEXAMETHASONE SODIUM PHOSPHATE 10 MG/ML IJ SOLN
INTRAMUSCULAR | Status: DC | PRN
  Administered 2012-03-10: 10 mg via INTRAVENOUS

## 2012-03-10 MED ORDER — INDIGOTINDISULFONATE SODIUM 8 MG/ML IJ SOLN
INTRAMUSCULAR | Status: DC | PRN
  Administered 2012-03-10: 3 mL via INTRAVENOUS

## 2012-03-10 MED ORDER — MIDAZOLAM HCL 5 MG/5ML IJ SOLN
INTRAMUSCULAR | Status: DC | PRN
  Administered 2012-03-10: 2 mg via INTRAVENOUS

## 2012-03-10 MED ORDER — KETOROLAC TROMETHAMINE 30 MG/ML IJ SOLN
INTRAMUSCULAR | Status: AC
  Administered 2012-03-10: 30 mg via INTRAVENOUS
  Filled 2012-03-10: qty 1

## 2012-03-10 MED ORDER — 0.9 % SODIUM CHLORIDE (POUR BTL) OPTIME
TOPICAL | Status: DC | PRN
  Administered 2012-03-10: 1000 mL

## 2012-03-10 MED ORDER — HYDROMORPHONE HCL PF 1 MG/ML IJ SOLN
INTRAMUSCULAR | Status: AC
  Administered 2012-03-10: 0.25 mg via INTRAVENOUS
  Filled 2012-03-10: qty 1

## 2012-03-10 MED ORDER — NEOSTIGMINE METHYLSULFATE 1 MG/ML IJ SOLN
INTRAMUSCULAR | Status: AC
  Filled 2012-03-10: qty 10

## 2012-03-10 MED ORDER — ONDANSETRON HCL 4 MG/2ML IJ SOLN
INTRAMUSCULAR | Status: AC
  Filled 2012-03-10: qty 2

## 2012-03-10 MED ORDER — LIDOCAINE-EPINEPHRINE 1 %-1:100000 IJ SOLN
INTRAMUSCULAR | Status: DC | PRN
  Administered 2012-03-10: 7 mL

## 2012-03-10 MED ORDER — ONDANSETRON HCL 4 MG/2ML IJ SOLN
INTRAMUSCULAR | Status: DC | PRN
  Administered 2012-03-10: 4 mg via INTRAVENOUS

## 2012-03-10 MED ORDER — BUPIVACAINE HCL (PF) 0.25 % IJ SOLN
INTRAMUSCULAR | Status: DC | PRN
  Administered 2012-03-10: 10 mL

## 2012-03-10 MED ORDER — KETOROLAC TROMETHAMINE 30 MG/ML IJ SOLN: 30.0000 mg | Freq: Four times a day (QID) | INTRAMUSCULAR | Status: DC | PRN

## 2012-03-10 MED ORDER — GLYCOPYRROLATE 0.2 MG/ML IJ SOLN
INTRAMUSCULAR | Status: AC
  Filled 2012-03-10: qty 3

## 2012-03-10 MED ORDER — PROPOFOL 10 MG/ML IV BOLUS
INTRAVENOUS | Status: DC | PRN
  Administered 2012-03-10: 170 mg via INTRAVENOUS

## 2012-03-10 MED ORDER — BUPIVACAINE HCL (PF) 0.25 % IJ SOLN
INTRAMUSCULAR | Status: AC
  Filled 2012-03-10: qty 30

## 2012-03-10 MED ORDER — FENTANYL 10 MCG/ML IV SOLN
INTRAVENOUS | Status: DC
  Administered 2012-03-10: 7.5 mL via INTRAVENOUS
  Administered 2012-03-10: 16:00:00 via INTRAVENOUS
  Administered 2012-03-10: 180 ug via INTRAVENOUS
  Administered 2012-03-11: 315 ug via INTRAVENOUS
  Administered 2012-03-11: 135 ug via INTRAVENOUS
  Administered 2012-03-11: 176.8 ug via INTRAVENOUS
  Administered 2012-03-11: 06:00:00 via INTRAVENOUS
  Filled 2012-03-10 (×2): qty 50

## 2012-03-10 MED ORDER — NALOXONE HCL 0.4 MG/ML IJ SOLN: 0.4000 mg | INTRAMUSCULAR | Status: DC | PRN

## 2012-03-10 MED ORDER — SCOPOLAMINE 1 MG/3DAYS TD PT72
MEDICATED_PATCH | TRANSDERMAL | Status: AC
  Filled 2012-03-10: qty 1

## 2012-03-10 MED ORDER — IBUPROFEN 600 MG PO TABS
600.0000 mg | ORAL_TABLET | Freq: Four times a day (QID) | ORAL | Status: DC | PRN
  Administered 2012-03-11 – 2012-03-12 (×3): 600 mg via ORAL
  Filled 2012-03-10 (×3): qty 1

## 2012-03-10 MED ORDER — NEOSTIGMINE METHYLSULFATE 1 MG/ML IJ SOLN
INTRAMUSCULAR | Status: DC | PRN
  Administered 2012-03-10: 4 mg via INTRAVENOUS

## 2012-03-10 MED ORDER — LACTATED RINGERS IR SOLN
Status: DC | PRN
  Administered 2012-03-10: 3000 mL

## 2012-03-10 MED ORDER — PROPOFOL 10 MG/ML IV EMUL
INTRAVENOUS | Status: AC
  Filled 2012-03-10: qty 20

## 2012-03-10 MED ORDER — DIPHENHYDRAMINE HCL 50 MG/ML IJ SOLN
12.5000 mg | Freq: Four times a day (QID) | INTRAMUSCULAR | Status: DC | PRN
  Administered 2012-03-10: 12.5 mg via INTRAVENOUS
  Filled 2012-03-10: qty 1

## 2012-03-10 MED ORDER — FENTANYL CITRATE 0.05 MG/ML IJ SOLN
INTRAMUSCULAR | Status: AC
  Filled 2012-03-10: qty 5

## 2012-03-10 MED ORDER — MENTHOL 3 MG MT LOZG: 1.0000 | LOZENGE | OROMUCOSAL | Status: DC | PRN

## 2012-03-10 MED ORDER — CEFAZOLIN SODIUM-DEXTROSE 2-3 GM-% IV SOLR
INTRAVENOUS | Status: AC
  Filled 2012-03-10: qty 50

## 2012-03-10 MED ORDER — SCOPOLAMINE 1 MG/3DAYS TD PT72
1.0000 | MEDICATED_PATCH | TRANSDERMAL | Status: DC
  Administered 2012-03-10: 1.5 mg via TRANSDERMAL

## 2012-03-10 MED ORDER — GLYCOPYRROLATE 0.2 MG/ML IJ SOLN
INTRAMUSCULAR | Status: DC | PRN
  Administered 2012-03-10: .7 mg via INTRAVENOUS
  Administered 2012-03-10: 0.1 mg via INTRAVENOUS

## 2012-03-10 MED ORDER — CEFAZOLIN SODIUM-DEXTROSE 2-3 GM-% IV SOLR
2.0000 g | INTRAVENOUS | Status: AC
  Administered 2012-03-10: 2 g via INTRAVENOUS

## 2012-03-10 MED ORDER — DEXAMETHASONE SODIUM PHOSPHATE 10 MG/ML IJ SOLN
INTRAMUSCULAR | Status: AC
  Filled 2012-03-10: qty 1

## 2012-03-10 MED ORDER — MIDAZOLAM HCL 2 MG/2ML IJ SOLN
INTRAMUSCULAR | Status: AC
  Filled 2012-03-10: qty 2

## 2012-03-10 MED ORDER — LIDOCAINE HCL (CARDIAC) 20 MG/ML IV SOLN
INTRAVENOUS | Status: DC | PRN
  Administered 2012-03-10: 80 mg via INTRAVENOUS

## 2012-03-10 MED ORDER — HYDROMORPHONE HCL PF 1 MG/ML IJ SOLN
0.2500 mg | INTRAMUSCULAR | Status: DC | PRN
  Administered 2012-03-10 (×3): 0.25 mg via INTRAVENOUS

## 2012-03-10 MED ORDER — CEFAZOLIN SODIUM 1-5 GM-% IV SOLN
1.0000 g | Freq: Three times a day (TID) | INTRAVENOUS | Status: AC
  Administered 2012-03-10 – 2012-03-11 (×3): 1 g via INTRAVENOUS
  Filled 2012-03-10 (×3): qty 50

## 2012-03-10 MED ORDER — ROCURONIUM BROMIDE 50 MG/5ML IV SOLN
INTRAVENOUS | Status: AC
  Filled 2012-03-10: qty 1

## 2012-03-10 MED ORDER — LACTATED RINGERS IV SOLN
INTRAVENOUS | Status: DC
  Administered 2012-03-10 (×4): via INTRAVENOUS

## 2012-03-10 MED ORDER — CEFAZOLIN SODIUM 1-5 GM-% IV SOLN
1.0000 g | Freq: Three times a day (TID) | INTRAVENOUS | Status: DC
  Filled 2012-03-10 (×3): qty 50

## 2012-03-10 SURGICAL SUPPLY — 44 items
BLADE SURG 15 STRL LF C SS BP (BLADE) ×3 IMPLANT
BLADE SURG 15 STRL SS (BLADE) ×4
CANISTER SUCTION 2500CC (MISCELLANEOUS) ×4 IMPLANT
CLOTH BEACON ORANGE TIMEOUT ST (SAFETY) ×4 IMPLANT
CONT PATH 16OZ SNAP LID 3702 (MISCELLANEOUS) ×4 IMPLANT
COVER TABLE BACK 60X90 (DRAPES) ×4 IMPLANT
DECANTER SPIKE VIAL GLASS SM (MISCELLANEOUS) ×4 IMPLANT
ELECT REM PT RETURN 9FT ADLT (ELECTROSURGICAL) ×4
ELECTRODE REM PT RTRN 9FT ADLT (ELECTROSURGICAL) ×3 IMPLANT
FORCEPS CUTTING 33CM 5MM (CUTTING FORCEPS) ×2 IMPLANT
GAUZE PACKING IODOFORM 2 (PACKING) ×2 IMPLANT
GLOVE BIO SURGEON STRL SZ7.5 (GLOVE) ×12 IMPLANT
GLOVE BIOGEL PI IND STRL 6.5 (GLOVE) ×3 IMPLANT
GLOVE BIOGEL PI INDICATOR 6.5 (GLOVE) ×1
GOWN PREVENTION PLUS LG XLONG (DISPOSABLE) ×4 IMPLANT
GOWN PREVENTION PLUS XXLARGE (GOWN DISPOSABLE) ×4 IMPLANT
GOWN STRL REIN XL XLG (GOWN DISPOSABLE) ×16 IMPLANT
NDL HYPO 25X1 1.5 SAFETY (NEEDLE) IMPLANT
NEEDLE HYPO 25X1 1.5 SAFETY (NEEDLE) ×4 IMPLANT
NS IRRIG 1000ML POUR BTL (IV SOLUTION) ×4 IMPLANT
PACK ABDOMINAL GYN (CUSTOM PROCEDURE TRAY) ×2 IMPLANT
PACK LAVH (CUSTOM PROCEDURE TRAY) ×4 IMPLANT
PAD OB MATERNITY 4.3X12.25 (PERSONAL CARE ITEMS) ×4 IMPLANT
PROTECTOR NERVE ULNAR (MISCELLANEOUS) ×6 IMPLANT
SET IRRIG TUBING LAPAROSCOPIC (IRRIGATION / IRRIGATOR) IMPLANT
SOLUTION ELECTROLUBE (MISCELLANEOUS) ×4 IMPLANT
SPONGE LAP 18X18 X RAY DECT (DISPOSABLE) ×4 IMPLANT
STAPLER VISISTAT 35W (STAPLE) ×2 IMPLANT
STRIP CLOSURE SKIN 1/4X3 (GAUZE/BANDAGES/DRESSINGS) ×2 IMPLANT
SUT CHROMIC 0 CT 1 (SUTURE) IMPLANT
SUT VIC AB 0 CT1 18XCR BRD8 (SUTURE) ×8 IMPLANT
SUT VIC AB 0 CT1 27 (SUTURE) ×8
SUT VIC AB 0 CT1 27XBRD ANBCTR (SUTURE) ×10 IMPLANT
SUT VIC AB 0 CT1 8-18 (SUTURE) ×8
SUT VIC AB 2-0 SH 27 (SUTURE)
SUT VIC AB 2-0 SH 27XBRD (SUTURE) IMPLANT
SUT VIC AB 3-0 X1 27 (SUTURE) ×4 IMPLANT
SUT VICRYL 0 TIES 12 18 (SUTURE) ×4 IMPLANT
SUT VICRYL 1 TIES 12X18 (SUTURE) ×4 IMPLANT
SYR CONTROL 10ML LL (SYRINGE) ×2 IMPLANT
TOWEL OR 17X24 6PK STRL BLUE (TOWEL DISPOSABLE) ×8 IMPLANT
TRAY FOLEY CATH 14FR (SET/KITS/TRAYS/PACK) ×4 IMPLANT
WARMER LAPAROSCOPE (MISCELLANEOUS) ×4 IMPLANT
WATER STERILE IRR 1000ML POUR (IV SOLUTION) ×2 IMPLANT

## 2012-03-10 NOTE — Anesthesia Procedure Notes (Signed)
Procedure Name: Intubation Date/Time: 03/10/2012 12:55 PM Performed by: Graciela Husbands Pre-anesthesia Checklist: Suction available, Emergency Drugs available, Timeout performed, Patient being monitored and Patient identified Patient Re-evaluated:Patient Re-evaluated prior to inductionOxygen Delivery Method: Circle system utilized Preoxygenation: Pre-oxygenation with 100% oxygen Intubation Type: IV induction Ventilation: Oral airway inserted - appropriate to patient size and Mask ventilation without difficulty Laryngoscope Size: Mac and 3 Grade View: Grade I Tube type: Oral Tube size: 7.0 mm Number of attempts: 1 Airway Equipment and Method: Stylet Placement Confirmation: ETT inserted through vocal cords under direct vision,  positive ETCO2 and breath sounds checked- equal and bilateral Secured at: 21 cm Tube secured with: Tape Dental Injury: Teeth and Oropharynx as per pre-operative assessment

## 2012-03-10 NOTE — Anesthesia Preprocedure Evaluation (Signed)
Anesthesia Evaluation  Patient identified by MRN, date of birth, ID band Patient awake    Reviewed: Allergy & Precautions, H&P , Patient's Chart, lab work & pertinent test results, reviewed documented beta blocker date and time   History of Anesthesia Complications (+) PONV  Airway Mallampati: II TM Distance: >3 FB Neck ROM: full    Dental No notable dental hx.    Pulmonary  breath sounds clear to auscultation  Pulmonary exam normal       Cardiovascular Rhythm:regular Rate:Normal     Neuro/Psych    GI/Hepatic   Endo/Other    Renal/GU      Musculoskeletal   Abdominal   Peds  Hematology   Anesthesia Other Findings   Reproductive/Obstetrics                           Anesthesia Physical Anesthesia Plan  ASA: II  Anesthesia Plan: General   Post-op Pain Management:    Induction: Intravenous  Airway Management Planned: Oral ETT  Additional Equipment:   Intra-op Plan:   Post-operative Plan:   Informed Consent: I have reviewed the patients History and Physical, chart, labs and discussed the procedure including the risks, benefits and alternatives for the proposed anesthesia with the patient or authorized representative who has indicated his/her understanding and acceptance.   Dental Advisory Given and Dental advisory given  Plan Discussed with: CRNA and Surgeon  Anesthesia Plan Comments: (  Discussed  general anesthesia, including possible nausea, instrumentation of airway, sore throat,pulmonary aspiration, etc. I asked if the were any outstanding questions, or  concerns before we proceeded. )        Anesthesia Quick Evaluation  

## 2012-03-10 NOTE — Brief Op Note (Signed)
03/10/2012  2:15 PM  PATIENT:  Robin Parks  41 y.o. female  PRE-OPERATIVE DIAGNOSIS:  CHRONIC PELVIC PAIN ENDOMETRIOSIS  POST-OPERATIVE DIAGNOSIS:  CHRONIC PELVIC PAIN ENDOMETRIOSIS  PROCEDURE:  Procedure(s) (LRB) with comments: LAPAROSCOPIC ASSISTED VAGINAL HYSTERECTOMY (N/A) SALPINGO OOPHORECTOMY (Bilateral)  SURGEON:  Surgeon(s) and Role:    * Miguel Aschoff, MD - Primary    * W Lodema Hong, MD - Assisting   ANESTHESIA:   general  EBL:  Total I/O In: 2000 [I.V.:2000] Out: 850 [Urine:400; Blood:450]  BLOOD ADMINISTERED:none  DRAINS: Urinary Catheter (Foley)   LOCAL MEDICATIONS USED:  MARCAINE     SPECIMEN:  Source of Specimen:  Uterus and tubes and ovaries  DISPOSITION OF SPECIMEN:  PATHOLOGY  COUNTS:  YES  TOURNIQUET:  * No tourniquets in log *  DICTATION: Dictated: [id number]  PLAN OF CARE: Admit for overnight observation  PATIENT DISPOSITION:  PACU - hemodynamically stable.   Delay start of Pharmacological VTE agent (>24hrs) due to surgical blood loss or risk of bleeding: PAS hose applied

## 2012-03-10 NOTE — Transfer of Care (Signed)
Immediate Anesthesia Transfer of Care Note  Patient: Robin Parks  Procedure(s) Performed: Procedure(s) (LRB) with comments: LAPAROSCOPIC ASSISTED VAGINAL HYSTERECTOMY (N/A) SALPINGO OOPHORECTOMY (Bilateral)  Patient Location: PACU  Anesthesia Type:General  Level of Consciousness: awake, alert  and oriented  Airway & Oxygen Therapy: Patient Spontanous Breathing and Patient connected to nasal cannula oxygen  Post-op Assessment: Report given to PACU RN and Post -op Vital signs reviewed and stable  Post vital signs: Reviewed and stable  Complications: No apparent anesthesia complications

## 2012-03-10 NOTE — Anesthesia Postprocedure Evaluation (Signed)
Anesthesia Post Note  Patient: Robin Parks  Procedure(s) Performed: Procedure(s) (LRB): LAPAROSCOPIC ASSISTED VAGINAL HYSTERECTOMY (N/A) SALPINGO OOPHORECTOMY (Bilateral)  Anesthesia type: General  Patient location: PACU  Post pain: Pain level controlled  Post assessment: Post-op Vital signs reviewed  Last Vitals:  Filed Vitals:   03/10/12 1530  BP:   Pulse: 67  Temp: 36.6 C  Resp:     Post vital signs: Reviewed  Level of consciousness: sedated  Complications: No apparent anesthesia complications

## 2012-03-11 ENCOUNTER — Encounter (HOSPITAL_COMMUNITY): Payer: Self-pay | Admitting: Obstetrics and Gynecology

## 2012-03-11 LAB — CBC
Hemoglobin: 10.6 g/dL — ABNORMAL LOW (ref 12.0–15.0)
MCH: 31.3 pg (ref 26.0–34.0)
MCV: 94.4 fL (ref 78.0–100.0)
Platelets: 153 10*3/uL (ref 150–400)
RBC: 3.39 MIL/uL — ABNORMAL LOW (ref 3.87–5.11)
WBC: 12.3 10*3/uL — ABNORMAL HIGH (ref 4.0–10.5)

## 2012-03-11 MED ORDER — HYDROCODONE-ACETAMINOPHEN 10-325 MG PO TABS: 1.0000 | ORAL_TABLET | ORAL | Status: DC | PRN

## 2012-03-11 MED ORDER — DIPHENHYDRAMINE HCL 25 MG PO CAPS
25.0000 mg | ORAL_CAPSULE | Freq: Four times a day (QID) | ORAL | Status: DC | PRN
  Administered 2012-03-11 – 2012-03-12 (×2): 25 mg via ORAL
  Filled 2012-03-11 (×2): qty 1

## 2012-03-11 MED ORDER — HYDROCODONE-ACETAMINOPHEN 5-325 MG PO TABS
2.0000 | ORAL_TABLET | ORAL | Status: DC | PRN
  Administered 2012-03-11: 2 via ORAL
  Administered 2012-03-12 (×2): 1 via ORAL
  Filled 2012-03-11: qty 2
  Filled 2012-03-11 (×3): qty 1

## 2012-03-11 MED ORDER — HYDROMORPHONE HCL 2 MG PO TABS
2.0000 mg | ORAL_TABLET | ORAL | Status: DC | PRN
  Administered 2012-03-11 (×2): 2 mg via ORAL
  Filled 2012-03-11 (×2): qty 1

## 2012-03-11 NOTE — Progress Notes (Signed)
Vaginal packing removed.  Scant amount of bright red blood noted on packing.  Pt tolerated well.

## 2012-03-11 NOTE — Addendum Note (Signed)
Addendum  created 03/11/12 1345 by Earmon Phoenix, CRNA   Modules edited:Notes Section

## 2012-03-11 NOTE — Progress Notes (Signed)
S: Stable today. Now off PCA but having pruritis on Dilaudid. Taking PO well  O: Afebrile V/S stable  Abdomen soft minimal tenderness. Wounds healing well  Hg  10.6  Impression: Stable status post LAVH BSO  Allergic reaction to Dilaudid  Plan: Change to Vicodin 5/300 has taken before without reaction  Add Benadryl as needed for pruritis.

## 2012-03-11 NOTE — Op Note (Signed)
NAMETONY, FRISCIA NO.:  [id number]  MEDICAL RECORD NO.:  [id number]  LOCATION:  9309                          FACILITY:  [facility]  PHYSICIAN:  Miguel Aschoff, M.D.       DATE OF BIRTH:  Aug 28, 1970  DATE OF PROCEDURE:  03/10/2012 DATE OF DISCHARGE:                              OPERATIVE REPORT   PREOPERATIVE DIAGNOSIS:  Chronic pelvic pain, endometriosis, dysmenorrhea, and dyspareunia.  POSTOPERATIVE DIAGNOSIS:  Chronic pelvic pain, endometriosis, dysmenorrhea, and dyspareunia.  PROCEDURE:  Laparoscopically-assisted total vaginal hysterectomy and bilateral salpingo-oophorectomy.  SURGEON:  Miguel Aschoff, MD  ASSISTANT:  Luvenia Redden, MD  ANESTHESIA:  General.  COMPLICATIONS:  None.  JUSTIFICATION:  The patient is a 41 year old, white female who has had history of persistent chronic pelvic pain.  This had not been responsive to medical therapy.  An outpatient evaluation with ultrasound did not reveal the source of the pain.  The patient was ultimately laparoscoped in September 2013, found to have pelvic endometriosis and adhesions holding her right ovary to the pelvic sidewall.  The adhesions were lysed at the time of laparoscopy.  Endometriosis was fulgurated, however the patient returned still reporting significant pelvic pain.  At this point, she requested a definitive procedure to relieve the pain and is being brought to the operating room at this time to undergo laparoscopically-assisted vaginal hysterectomy and bilateral salpingo- oophorectomy.  The risks and benefits of procedure were discussed with the patient and informed consent has been obtained.  PROCEDURE IN DETAIL:  The patient was taken to the operating room, placed in the supine position.  General anesthesia was administered without difficulty.  She was then placed in modified lithotomy position, prepped and draped in usual sterile fashion.  A Foley catheter was inserted.  At this point, a  speculum was placed in the vaginal vault, and a Hulka tenaculum was placed through the cervix for manipulation. After this was done, attention was directed to the abdomen where small infraumbilical incision was made.  A Veress needle was inserted and the abdomen was insufflated with 3 L of CO2.  Following the insufflation, the trocar to the laparoscope was placed followed by laparoscope itself. Then under direct visualization, 2 accessory ports were established in the right and left lower quadrants, these were 5 mm ports.  Systematic inspection showed the uterus to be anterior.  The right ovary had re- adhered to the pelvic sidewall, but was otherwise unremarkable.  The left ovary was free of adhesions and unremarkable.  There were small powder burn lesions of endometriosis noted in the cul-de-sac.  There was no obliteration of the cul-de-sac.  No other abnormal intra-abdominal findings were noted.  At this point, using the gyrus unit, the right infundibulopelvic ligament was identified, grasped, and fulgurated and then cut.  This was done with care to avoid any injury to the ureter, prior to this it was possible to free the right ovary off the right pelvic sidewall.  Dissection continued using serial cauterizations and cuts and this continued along the mesovarium ligament and then mesosalpinx, and this continued until the coronal region of the uterus was reached.  Then the round ligament  was grasped, cauterized and cut, and dissection continued along the broad ligament structures until the uterine vessels were reached.  The identical procedure was then carried out on the left side.  Once this was done and we were down to the level of the uterine vessels, it was elected to proceed with vaginal portion of the procedure.  The laparoscope was removed.  The patient was placed in high lithotomy position.  The Hulka tenaculum was removed.  The speculum was placed in the vaginal vault, and then  the cervix was injected with a total of 8 mL of 1% Xylocaine with epinephrine.  The cervical mucosa was then circumscribed and dissected anteriorly and posteriorly until the peritoneal reflections were found.  The posterior peritoneum was then entered.  Then using curved Heaney clamps, the uterosacral ligaments were grasped, cut, and suture ligated using suture ligatures of 0 Vicryl.  The cardinal ligaments were clamped, cut, and suture ligated in similar fashion.  Additional bites were taken in paracervical fascia, all using curved Heaney clamps.  All pedicles being cut and suture ligated using suture ligatures of 0 Vicryl.  Additional bites were then taken of the broad ligament structures,  this continued until it was possible to free the specimen consisting of the cervix, uterus, tubes, and ovaries from the small additional broad ligament structures.  These pedicles were cut and ligated using ligatures of 0 Vicryl.  When specimen freed, there was one area of bleeding noted at the left angle of the cuff.  The vessel was identified and suture ligated using a 2-0 Vicryl suture.  Then the posterior cuff was closed using running interlocking 0 Vicryl suture.  The peritoneum was then closed using purse-string suture of 0 Vicryl suture and then the vaginal mucosa was reapproximated using running interlocking 0 Vicryl suture. After this was done, the uterosacral ligaments which had been previously tagged were ligated to provide support to the apex of the cuff.  The vaginal pack of iodoform gauze was then placed.  Attention was then directed back to the abdomen.  She was placed back in modified lithotomy position.  The abdomen was re-insufflated and inspection was made for hemostasis.  Hemostasis appeared to be excellent.  The abdomen was irrigated with saline, the Nezhat suction irrigator and with competence at all pedicles were hemostatically secure, was elected to complete the procedure.   The CO2 was allowed to escape.  All instruments removed and small abdominal incisions were closed using subcuticular 3-0 Vicryl. Band-aids were then applied and then the port sites were injected with total of 10 mL of 1% Marcaine for postop analgesia.  The estimated blood from the procedure was 450 mL.  The patient did tolerate the procedure well, went to the recovery room in satisfactory condition.     Miguel Aschoff, M.D.     AR/MEDQ  D:  03/10/2012  T:  03/11/2012  Job:  [id number]

## 2012-03-11 NOTE — Anesthesia Postprocedure Evaluation (Signed)
  Anesthesia Post-op Note  Patient: Robin Parks  Procedure(s) Performed: Procedure(s) (LRB) with comments: LAPAROSCOPIC ASSISTED VAGINAL HYSTERECTOMY (N/A) SALPINGO OOPHORECTOMY (Bilateral)  Patient Location: [facility]  Anesthesia Type:General  Level of Consciousness: awake, alert  and oriented  Airway and Oxygen Therapy: Patient Spontanous Breathing  Post-op Pain: mild  Post-op Assessment: Patient's Cardiovascular Status Stable, Respiratory Function Stable, No signs of Nausea or vomiting and Pain level controlled  Post-op Vital Signs: stable  Complications: No apparent anesthesia complications

## 2012-03-11 NOTE — Progress Notes (Signed)
Ur chart review completed.  

## 2012-03-12 NOTE — Progress Notes (Signed)
S: Doing well. Now on PO pain meds. Taking regular diet. Had stable night  O: Afebrile  V/S stable  Abdomen soft wounds healing well  Path pending.  Impression: Satisfactory post op course.  Plan:  D/C home            Return to office in 4weeks           Regular diet           Meds: Vicodin 5/300                       Estradiol 1 mg            Received post op vaginal hysterectomy instructions.

## 2012-03-17 NOTE — Discharge Summary (Signed)
Robin Parks, STAUB NO.:  [id number]  MEDICAL RECORD NO.:  [id number]  LOCATION:  9309                          FACILITY:  [facility]  PHYSICIAN:  Miguel Aschoff, M.D.       DATE OF BIRTH:  1970/10/29  DATE OF ADMISSION:  03/10/2012 DATE OF DISCHARGE:  03/12/2012                              DISCHARGE SUMMARY   ADMISSION DIAGNOSES:  Chronic pelvic pain, pelvic adhesions, pelvic endometriosis.  FINAL DIAGNOSES:  Chronic pelvic pain, pelvic adhesions, pelvic endometriosis.  OPERATIONS AND PROCEDURES:  Laparoscopically-assisted total vaginal hysterectomy and bilateral salpingo-oophorectomy.  ANESTHESIA:  General.  BRIEF HISTORY:  The patient is a 41 year old white female with long history of persistent pelvic pain.  She underwent a prior laparoscopy in September 2013, at which time, she was noted to have pelvic adhesions holding the right ovary to the pelvic sidewall as well as other adhesions and also was noted to have endometriosis with characteristic implants on her uterosacral ligaments and powder-burn lesions within the cul-de-sac.  The adhesions were taken down and the ovary freed from the side wall at that time; however, the patient persisted in having persistent pelvic pain, and at this point, definitive procedure carried out to eliminate the pelvic pain, dysmenorrhea and dyspareunia.  She was therefore admitted to the hospital to undergo laparoscopically-assisted total vaginal hysterectomy and bilateral salpingo-oophorectomy.  Prior to surgery, it was discussed with the patient that removal of the ovaries would require taking hormone replacement therapy and she understood this and gave her consent.  HOSPITAL COURSE:  On March 10, 2012, the patient underwent a laparoscopically-assisted total vaginal hysterectomy and bilateral salpingo-oophorectomy without problems.  Preoperative studies were unremarkable.  Her admission hemoglobin was 13.2.  It did reach a  nadir following the surgery of 10.6.  The patient remained stable following the surgical procedure, tolerating increasing ambulation and diet well, and by the second postoperative day was in satisfactory condition and able to be discharged home.  Medications for home included Vicodin 1 every 3-4 hours as needed for pain, estradiol 1 mg daily.  She was told to place nothing in the vagina, to call for any problems such as fever, pain, or heavy bleeding.  The patient was told to set up a followup visit for 4 weeks and call for any problems.  She was also told to place nothing in vagina until seen for postop visit.  The pathology report on the hysterectomy specimen showed focal mild squamous dysplasia, margins not involve, proliferative endometrium, unremarkable myometrium.  Tubes and ovaries were felt to be within normal limits.  The specimen weighing 90 g.  The patient was sent home on a regular diet and in satisfactory condition.     Miguel Aschoff, M.D.     AR/MEDQ  D:  03/16/2012  T:  03/17/2012  Job:  [id number]

## 2012-06-10 ENCOUNTER — Ambulatory Visit (INDEPENDENT_AMBULATORY_CARE_PROVIDER_SITE_OTHER): Payer: BC Managed Care – PPO | Admitting: Family Medicine

## 2012-06-10 ENCOUNTER — Encounter: Payer: Self-pay | Admitting: Family Medicine

## 2012-06-10 VITALS — BP 120/80 | HR 72 | Ht 65.0 in | Wt 174.0 lb

## 2012-06-10 DIAGNOSIS — K219 Gastro-esophageal reflux disease without esophagitis: Secondary | ICD-10-CM

## 2012-06-10 DIAGNOSIS — F411 Generalized anxiety disorder: Secondary | ICD-10-CM

## 2012-06-10 DIAGNOSIS — R1011 Right upper quadrant pain: Secondary | ICD-10-CM

## 2012-06-10 LAB — CBC WITH DIFFERENTIAL/PLATELET
Basophils Absolute: 0 10*3/uL (ref 0.0–0.1)
Eosinophils Absolute: 0.2 10*3/uL (ref 0.0–0.7)
Eosinophils Relative: 2 % (ref 0–5)
Lymphocytes Relative: 38 % (ref 12–46)
MCH: 30.6 pg (ref 26.0–34.0)
MCV: 89.1 fL (ref 78.0–100.0)
Platelets: 222 10*3/uL (ref 150–400)
RDW: 13.4 % (ref 11.5–15.5)
WBC: 8.3 10*3/uL (ref 4.0–10.5)

## 2012-06-10 LAB — COMPREHENSIVE METABOLIC PANEL
ALT: 19 U/L (ref 0–35)
AST: 19 U/L (ref 0–37)
Creat: 0.68 mg/dL (ref 0.50–1.10)
Total Bilirubin: 0.4 mg/dL (ref 0.3–1.2)

## 2012-06-10 NOTE — Patient Instructions (Addendum)
Follow a low fat diet in order to prevent pain from possible gallstone.  Avoid fatty and fried foods. Cut back on alcohol. We will order ultrasound--if no evidence of infection, but stone seen, will try dietary measures.  Only refer for surgery if ongoing pain. If no stones on ultrasound, then likely a different etiology.  Given that stretching, massage and ice and stretching helps, I have to think there is also a muscular component to the pain.  You can try Aleve twice daily with food for 1-2 weeks to see if you get complete resolution of pain.    Diet for Gastroesophageal Reflux Disease, Adult Reflux (acid reflux) is when acid from your stomach flows up into the esophagus. When acid comes in contact with the esophagus, the acid causes irritation and soreness (inflammation) in the esophagus. When reflux happens often or so severely that it causes damage to the esophagus, it is called gastroesophageal reflux disease (GERD). Nutrition therapy can help ease the discomfort of GERD. FOODS OR DRINKS TO AVOID OR LIMIT  Smoking or chewing tobacco. Nicotine is one of the most potent stimulants to acid production in the gastrointestinal tract.  Caffeinated and decaffeinated coffee and black tea.  Regular or low-calorie carbonated beverages or energy drinks (caffeine-free carbonated beverages are allowed).   Strong spices, such as black pepper, white pepper, red pepper, cayenne, curry powder, and chili powder.  Peppermint or spearmint.  Chocolate.  High-fat foods, including meats and fried foods. Extra added fats including oils, butter, salad dressings, and nuts. Limit these to less than 8 tsp per day.  Fruits and vegetables if they are not tolerated, such as citrus fruits or tomatoes.  Alcohol.  Any food that seems to aggravate your condition. If you have questions regarding your diet, call your caregiver or a registered dietitian. OTHER THINGS THAT MAY HELP GERD INCLUDE:   Eating your meals  slowly, in a relaxed setting.  Eating 5 to 6 small meals per day instead of 3 large meals.  Eliminating food for a period of time if it causes distress.  Not lying down until 3 hours after eating a meal.  Keeping the head of your bed raised 6 to 9 inches (15 to 23 cm) by using a foam wedge or blocks under the legs of the bed. Lying flat may make symptoms worse.  Being physically active. Weight loss may be helpful in reducing reflux in overweight or obese adults.  Wear loose fitting clothing EXAMPLE MEAL PLAN This meal plan is approximately 2,000 calories based on https://www.bernard.org/ meal planning guidelines. Breakfast   cup cooked oatmeal.  1 cup strawberries.  1 cup low-fat milk.  1 oz almonds. Snack  1 cup cucumber slices.  6 oz yogurt (made from low-fat or fat-free milk). Lunch  2 slice whole-wheat bread.  2 oz sliced [place].  2 tsp mayonnaise.  1 cup blueberries.  1 cup snap peas. Snack  6 whole-wheat crackers.  1 oz string cheese. Dinner   cup brown rice.  1 cup mixed veggies.  1 tsp olive oil.  3 oz grilled fish. Document Released: 03/25/2005 Document Revised: 06/17/2011 Document Reviewed: 02/08/2011 [facility] Patient Information 2013 [place], [place].   ANXIETY--I don't recommend use of alprazolam daily longterm.  I like preventative measures for panic attacks better--ie lexapro (escitalopram).  We can discuss further at subsequent visit.
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
## 2012-06-12 ENCOUNTER — Other Ambulatory Visit: Payer: BC Managed Care – PPO

## 2012-06-12 DIAGNOSIS — F411 Generalized anxiety disorder: Secondary | ICD-10-CM | POA: Insufficient documentation

## 2012-06-12 DIAGNOSIS — K219 Gastro-esophageal reflux disease without esophagitis: Secondary | ICD-10-CM | POA: Insufficient documentation

## 2012-06-15 ENCOUNTER — Ambulatory Visit
Admission: RE | Admit: 2012-06-15 | Discharge: 2012-06-15 | Disposition: A | Discharge status: Home / Self Care | Payer: BC Managed Care – PPO | Source: Ambulatory Visit | Attending: Family Medicine | Admitting: Family Medicine

## 2012-06-15 DIAGNOSIS — R1011 Right upper quadrant pain: Secondary | ICD-10-CM

## 2012-06-24 ENCOUNTER — Encounter: Payer: Self-pay | Admitting: Family Medicine

## 2012-06-24 ENCOUNTER — Ambulatory Visit (INDEPENDENT_AMBULATORY_CARE_PROVIDER_SITE_OTHER): Payer: BC Managed Care – PPO | Admitting: Family Medicine

## 2012-06-24 VITALS — BP 122/76 | HR 72 | Ht 65.0 in | Wt 175.0 lb

## 2012-06-24 DIAGNOSIS — R1011 Right upper quadrant pain: Secondary | ICD-10-CM

## 2012-06-24 DIAGNOSIS — K219 Gastro-esophageal reflux disease without esophagitis: Secondary | ICD-10-CM

## 2012-06-24 DIAGNOSIS — F411 Generalized anxiety disorder: Secondary | ICD-10-CM

## 2012-06-24 DIAGNOSIS — M546 Pain in thoracic spine: Secondary | ICD-10-CM | POA: Insufficient documentation

## 2012-06-24 MED ORDER — ESOMEPRAZOLE MAGNESIUM 40 MG PO CPDR: 40.0000 mg | DELAYED_RELEASE_CAPSULE | Freq: Every day | ORAL | Status: DC

## 2012-06-24 NOTE — Progress Notes (Signed)
Chief Complaint  Patient presents with  . Follow-up    woke up Thursday night at 1am having a major panic attack. Pain is not really any better, still comes and goes. Drove to [facility] this past weekend she feels like there is a knot on her right side. Reflux and heartburn has gotten worse.    Patient presents for follow up on her back and abdominal pain.  Abdominal ultrasound was normal.  Her husband accompanies her today. Pain is in same location (back, below shoulder blade, and abdominal--epigastric and RUQ), but less severe and less often. While driving to [facility], she felt like there was a "knot" in the back. She has used heat, and been taking 400mg  of ibuprofen twice daily.  Feels like food is getting caught in her chest and throat, discomfort and tightness in her upper stomach. Worse with chicken and brocolli.  Doesn't happen daily, never with liquids, sometimes with yogurt--feels tight in chest.  Not sure if could be related to her nerves. Has heartburn almost daily now (increased in frequency). She takes otc zantac periodically, not regularly, with some help.  Anxiety--She stopped taking daily xanax.  Woke up Thursday night with a panic attack, and took xanax with good relief. Overall feels less anxious, doesn't feel like she needs the xanax daily (like she had been taking).  Past Medical History  Diagnosis Date  . PONV (postoperative nausea and vomiting)   . Anxiety     PANIC ATTACKS  . GERD (gastroesophageal reflux disease)   . Chronic lower back pain     no meds  . SVD (spontaneous vaginal delivery)     x 2  . Endometriosis    Past Surgical History  Procedure Laterality Date  . Bladder suspension  2004  . Urethral sling  2012    SLING MATERIAL REMOVED  . Diagnostic laparoscopy    . Dilation and curettage of uterus    . Appendectomy    . Laparoscopy  12/19/2011    Procedure: LAPAROSCOPY OPERATIVE;  Surgeon: Miguel Aschoff, MD;  Location: [facility];  Service: Gynecology;  Laterality: N/A;   . Laparoscopic assisted vaginal hysterectomy  03/10/2012    Procedure: LAPAROSCOPIC ASSISTED VAGINAL HYSTERECTOMY;  Surgeon: Miguel Aschoff, MD;  Location: [facility];  Service: Gynecology;  Laterality: N/A;  . Salpingoophorectomy  03/10/2012    Procedure: SALPINGO OOPHORECTOMY;  Surgeon: Miguel Aschoff, MD;  Location: [facility];  Service: Gynecology;  Laterality: Bilateral;   History   Social History  . Marital Status: Married    Spouse Name: N/A    Number of Children: 2  . Years of Education: N/A   Occupational History  . homemaker    Social History Main Topics  . Smoking status: Never Smoker   . Smokeless tobacco: Never Used  . Alcohol Use: 4.2 oz/week    7 Glasses of wine per week     Comment: wine daily (up to 10 ounces)  . Drug Use: No  . Sexually Active: No     Comment: husband vasectomy   Other Topics Concern  . Not on file   Social History Narrative   Lives with husband, son Denton Lank), daughter, Carley Hammed), 2 dogs, bearded dragon, fish.  Daughter plays soccer [employer])   Current outpatient prescriptions:ALPRAZolam (XANAX) 0.5 MG tablet, Take 0.5 mg by mouth at bedtime., Disp: , Rfl: ;  Biotin 1 MG CAPS, Take 1 each by mouth., Disp: , Rfl: ;  estradiol (ESTRACE) 1 MG tablet, , Disp: , Rfl: ;  Probiotic Product (SOLUBLE FIBER/PROBIOTICS PO), Take 1 capsule by mouth daily., Disp: , Rfl: ;  ranitidine (ZANTAC) 75 MG tablet, Take 75 mg by mouth 2 (two) times daily., Disp: , Rfl:  Calcium Carbonate-Vitamin D (CALCIUM 600+D) 600-400 MG-UNIT per tablet, Take 1 tablet by mouth daily., Disp: , Rfl:   Allergies  Allergen Reactions  . Codeine Shortness Of Breath    Panic attacks, heart palpitations  . Oxycodone Shortness Of Breath    Panic attacks, heart palpitations Vicodin OK   ROS:  Denies fevers, URI symptoms, chest pain, shortness of breath.  +dysphagia, heartburn. Denies bowel changes.  Anxiety per HPI, no depression.  Denies urinary symptoms, skin rashes, fatigue, headaches,  dizziness, or other complaints except per HPI  PHYSICAL EXAM: BP 122/76  Pulse 72  Ht 5\' 5"  (1.651 m)  Wt 175 lb (79.379 kg)  BMI 29.12 kg/m2  LMP 01/21/2012 Well developed, pleasant female in no distress Neck: no lymphadenopathy or mass Heart: regular rate and rhythm Lungs: clear bilaterally Abdomen: Epigastric and RUQ tenderness.  No rebound or guarding, no masses Back: spine nontender, no CVA tenderness. Tender at R paraspinous muscles (lower thoracic/upper lumbar) and slight rhomboid tenderness on right Extremities: no edema Psych: normal mood, affect, hygiene and grooming  Lab Results  Component Value Date   WBC 8.3 06/10/2012   HGB 13.8 06/10/2012   HCT 40.2 06/10/2012   MCV 89.1 06/10/2012   PLT 222 06/10/2012     Chemistry      Component Value Date/Time   NA 139 06/10/2012 1540   K 4.3 06/10/2012 1540   CL 103 06/10/2012 1540   CO2 27 06/10/2012 1540   BUN 10 06/10/2012 1540   CREATININE 0.68 06/10/2012 1540   CREATININE 0.64 03/10/2012 1051      Component Value Date/Time   CALCIUM 9.8 06/10/2012 1540   ALKPHOS 63 06/10/2012 1540   AST 19 06/10/2012 1540   ALT 19 06/10/2012 1540   BILITOT 0.4 06/10/2012 1540     RUQ u/s normal.  ASSESSMENT/PLAN:  GERD (gastroesophageal reflux disease) - Plan: esomeprazole (NEXIUM) 40 MG capsule  Anxiety state, unspecified  Abdominal pain, RUQ  Back pain, thoracic  Back pain--MSK in etiology.  Shown additional stretches.  Continue heat, massage.  Hold off on PT per pt request.  Call if interested PT referral.  Change to Aleve 2 BID for up to 2 weeks, then 1-2 as needed  GERD--some mild intermittent dysphagia.  Discussed barium swallow and endoscopy.  Will start Nexium 40mg  samples for 2-3 weeks, then step down to prilosec OTC 20mg --take daily.  Call for Nexium rx if recurrent symptoms after switching to prilosec OTC.  If ongoing symptoms and dysphagia, needs further eval (UGI or EGD)  Anxiety--use xanax prn. If increasing frequency of panic attacks  or generalized anxiety, consider preventative treatment, but doesn't seem necessary at this point.  F/u prn persistent/worsening symptoms

## 2012-06-24 NOTE — Patient Instructions (Signed)
Continue heat, stretches. Change from ibuprofen to aleve (naproxen) 2 tablets twice daily with food for up to 2 weeks, then if pain is much improved, you can back down to taking it just as needed. If your pain is not improving, call for referral for physical therapy.  Take nexium once daily (before breakfast).  Once samples are finished, you can change over to omeprazole 20mg  (prilosec OTC).  You likely will need to use this for a few months.  Take daily for heartburn.  If heartburn completely resolves, you can try backing off, and just using as needed (ie prior to foods tha ttend to trigger heartburn).  If you have ongoing difficulty swallowing, food getting caught, you may need further evaluation (referral for endoscopy vs barium swallow).

## 2012-11-04 ENCOUNTER — Encounter: Payer: Self-pay | Admitting: Family Medicine

## 2012-11-04 ENCOUNTER — Ambulatory Visit (INDEPENDENT_AMBULATORY_CARE_PROVIDER_SITE_OTHER): Payer: 59 | Admitting: Family Medicine

## 2012-11-04 VITALS — BP 122/78 | HR 76 | Ht 65.0 in | Wt 173.0 lb

## 2012-11-04 DIAGNOSIS — Z23 Encounter for immunization: Secondary | ICD-10-CM

## 2012-11-04 DIAGNOSIS — K219 Gastro-esophageal reflux disease without esophagitis: Secondary | ICD-10-CM

## 2012-11-04 DIAGNOSIS — R1084 Generalized abdominal pain: Secondary | ICD-10-CM

## 2012-11-04 DIAGNOSIS — L659 Nonscarring hair loss, unspecified: Secondary | ICD-10-CM

## 2012-11-04 DIAGNOSIS — R5381 Other malaise: Secondary | ICD-10-CM

## 2012-11-04 DIAGNOSIS — E7800 Pure hypercholesterolemia, unspecified: Secondary | ICD-10-CM

## 2012-11-04 DIAGNOSIS — Z0000 Encounter for general adult medical examination without abnormal findings: Secondary | ICD-10-CM

## 2012-11-04 LAB — POCT URINALYSIS DIPSTICK
Bilirubin, UA: NEGATIVE
Blood, UA: NEGATIVE
Ketones, UA: NEGATIVE
pH, UA: 5

## 2012-11-04 LAB — LIPID PANEL
Cholesterol: 209 mg/dL — ABNORMAL HIGH (ref 0–200)
Total CHOL/HDL Ratio: 3.2 Ratio
Triglycerides: 124 mg/dL (ref <150)
VLDL: 25 mg/dL (ref 0–40)

## 2012-11-04 NOTE — Progress Notes (Signed)
Robin Parks is a 42 y.o. female who presents for a complete physical.  She has the following concerns:  She presents accompanied by her husband, with complaints of ongoing abdominal pain.  She is no longer having any dysphagia.  Prilosec didn't seem to work as well as Rx Nexium.  She switched to OTC Nexium and heartburn symptoms have resolved but she is still having epigastric pain, and "belching like a sailor".  She feels swollen in her abdomen, had to go up in clothing size.  Feels like this is contributing to low back pain.  Complaining of fatigue.  "tired of feeling like a 42 yo person".  While at beach, had increased pain after dairy queen. She had a diarrheal illness about 3-4 weeks ago.  Panic attacks--some recurred when she had recurrent abdominal pain (which triggered the anxiety).  Uses xanax prn, 1/2 tablet at a time, usually a few times/month, although a little more recently.  When she has bouts of pain she gets anxious, might feel her hands get tingly until she can calm herself down with deep breaths  Recurrent knots on her head.  Has had cysts in the past.  They occasionally itch.  Nontender. Has never had them get infected.  Complaining of fatigue--+hair loss.  Denies weight change, temperature intolerance, mood changes, or other thyroid symptoms.  Husband denies sleep apnea.  She feels like she gets adequate amount of sleep.  Denies depression.  Health Maintenance: There is no immunization history on file for this patient. She doesn't recall last tetanus, possibly >10 years ago (no records received from Dr. Wylene Simmer) Last Pap smear: s/p hysterectomy; paps per GYN Last mammogram: has appt 9/3, last was 1 year ago Last colonoscopy: never Last DEXA: never Dentist: UTD Ophtho: age [age] Exercise: limited, due to lack of energy  Past Medical History  Diagnosis Date  . PONV (postoperative nausea and vomiting)   . Anxiety     PANIC ATTACKS  . GERD (gastroesophageal reflux disease)    . Chronic lower back pain     no meds  . SVD (spontaneous vaginal delivery)     x 2  . Endometriosis     Past Surgical History  Procedure Laterality Date  . Bladder suspension  2004  . Urethral sling  2012    SLING MATERIAL REMOVED  . Diagnostic laparoscopy    . Dilation and curettage of uterus    . Appendectomy    . Laparoscopy  12/19/2011    Procedure: LAPAROSCOPY OPERATIVE;  Surgeon: Miguel Aschoff, MD;  Location: [facility];  Service: Gynecology;  Laterality: N/A;  . Laparoscopic assisted vaginal hysterectomy  03/10/2012    Procedure: LAPAROSCOPIC ASSISTED VAGINAL HYSTERECTOMY;  Surgeon: Miguel Aschoff, MD;  Location: [facility];  Service: Gynecology;  Laterality: N/A;  . Salpingoophorectomy  03/10/2012    Procedure: SALPINGO OOPHORECTOMY;  Surgeon: Miguel Aschoff, MD;  Location: [facility];  Service: Gynecology;  Laterality: Bilateral;    History   Social History  . Marital Status: Married    Spouse Name: N/A    Number of Children: 2  . Years of Education: N/A   Occupational History  . homemaker    Social History Main Topics  . Smoking status: Never Smoker   . Smokeless tobacco: Never Used  . Alcohol Use: 4.2 oz/week    7 Glasses of wine per week     Comment: cut back from daily use; now 2-3 times/week  . Drug Use: No  . Sexually Active: Not  on file     Comment: husband vasectomy/hysterectomy   Other Topics Concern  . Not on file   Social History Narrative   Lives with husband, son Denton Lank), daughter, Carley Hammed), 2 dogs, bearded dragon.  Daughter plays soccer [employer]).  Works part time at [email or web address] (1-2x/week).    Family History  Problem Relation Age of Onset  . Hypertension Father   . Heart disease Father     CABG, late 59's  . Hyperlipidemia Father   . Cancer Maternal Grandmother     lung cancer, esophageal  . Colon polyps Maternal Grandfather   . Colon polyps Mother   . Diabetes Paternal Grandmother     Current outpatient prescriptions:ALPRAZolam (XANAX) 0.5 MG  tablet, Take 0.5 mg by mouth at bedtime., Disp: , Rfl: ;  Biotin 1 MG CAPS, Take 1 each by mouth., Disp: , Rfl: ;  Calcium Carbonate-Vitamin D (CALCIUM 600+D) 600-400 MG-UNIT per tablet, Take 1 tablet by mouth daily., Disp: , Rfl: ;  Digestive Enzymes (DIGESTIVE ENZYME PO), Take 1 capsule by mouth daily., Disp: , Rfl:  esomeprazole (NEXIUM) 40 MG capsule, Take 1 capsule (40 mg total) by mouth daily., Disp: 15 capsule, Rfl: 0;  estradiol (ESTRACE) 1 MG tablet, , Disp: , Rfl:   Currently taking 20mg  Nexium, not 40 (taking OTC verson)  Allergies  Allergen Reactions  . Codeine Shortness Of Breath    Panic attacks, heart palpitations  . Oxycodone Shortness Of Breath    Panic attacks, heart palpitations Vicodin OK   ROS:  The patient denies anorexia, fever, weight changes, headaches,  vision changes, decreased hearing, ear pain, sore throat, breast concerns, chest pain, palpitations, dizziness, syncope, dyspnea on exertion, cough, leg swelling, nausea, vomiting, diarrhea, constipation,  melena, hematochezia, indigestion/heartburn, hematuria, incontinence, dysuria, vaginal bleeding, discharge, odor or itch, genital lesions, joint pains, numbness, tingling, weakness, tremor, suspicious skin lesions, depression, abnormal bleeding/bruising, or enlarged lymph nodes. Dizzy when anxious/panicky Very slight urinary incontinence with urge or hard sneeze.  PHYSICAL EXAM: BP 134/84  Pulse 76  Ht 5\' 5"  (1.651 m)  Wt 173 lb (78.472 kg)  BMI 28.79 kg/m2  LMP 01/21/2012 122/78 on repeat by MD, RA  General Appearance:    Alert, cooperative, no distress, appears stated age  Head:    Normocephalic, without obvious abnormality, atraumatic  Eyes:    PERRL, conjunctiva/corneas clear, EOM's intact, fundi    benign  Ears:    Normal TM's and external ear canals  Nose:   Nares normal, mucosa normal, no drainage or sinus   tenderness  Throat:   Lips, mucosa, and tongue normal; teeth and gums normal  Neck:   Supple,  no lymphadenopathy;  thyroid:  no   enlargement/tenderness/nodules; no carotid   bruit or JVD  Back:    Spine nontender, no curvature, ROM normal, no CVA     tenderness  Lungs:     Clear to auscultation bilaterally without wheezes, rales or     ronchi; respirations unlabored  Chest Wall:    No tenderness or deformity   Heart:    Regular rate and rhythm, S1 and S2 normal, no murmur, rub   or gallop  Breast Exam:    Deferred to GYN  Abdomen:     Soft, nondistended, normoactive bowel sounds,    no masses, no hepatosplenomegaly. Mild diffuse abdominal discomfort in lower abdomen and epigastrium  Genitalia:    Deferred to GYN     Extremities:   No clubbing, cyanosis or edema  Pulses:  2+ and symmetric all extremities  Skin:   Skin color, texture, turgor normal, no rashes or lesions  Lymph nodes:   Cervical, supraclavicular, and axillary nodes normal  Neurologic:   CNII-XII intact, normal strength, sensation and gait; reflexes 2+ and symmetric throughout          Psych:   Normal mood, affect, hygiene and grooming.    ASSESSMENT/PLAN:  Routine general medical examination at a health care facility - Plan: POCT Urinalysis Dipstick, Visual acuity screening, TSH, Vitamin D 25 hydroxy, Lipid panel  Need for Tdap vaccination - Plan: Tdap vaccine greater than or equal to 7yo IM  Pure hypercholesterolemia - Plan: Lipid panel  Other malaise and fatigue - Plan: TSH, Vitamin D 25 hydroxy  Hair loss - Plan: TSH  GERD (gastroesophageal reflux disease)  Abdominal pain, generalized  Discussed trial of lactose-free diet for 1-2 weeks.  If ongoing bloating and abdominal, consider trial of gluten-free diet. Consider celiac testing in future.  GERD--partial improvement of symptoms, did better on rx dose of Nexium.  Recommended trial of increasing Nexium to 40 mg (2 OTC caps), and call for rx if abdominal pain improves.  Discussed monthly self breast exams and yearly mammograms after the age of 12; at  least 30 minutes of aerobic activity at least 5 days/week; proper sunscreen use reviewed; healthy diet, including goals of calcium and vitamin D intake and alcohol recommendations (less than or equal to 1 drink/day) reviewed; regular seatbelt use; changing batteries in smoke detectors.  Immunization recommendations discussed--TdaP given today.  Colonoscopy recommendations reviewed--age 21.  Check lipids today.  Fatigue: check TSH, Vitamin D-OH  F/u prn

## 2012-11-04 NOTE — Patient Instructions (Addendum)
HEALTH MAINTENANCE RECOMMENDATIONS:  It is recommended that you get at least 30 minutes of aerobic exercise at least 5 days/week (for weight loss, you may need as much as 60-90 minutes). This can be any activity that gets your heart rate up. This can be divided in 10-15 minute intervals if needed, but try and build up your endurance at least once a week.  Weight bearing exercise is also recommended twice weekly.  Eat a healthy diet with lots of vegetables, fruits and fiber.  "Colorful" foods have a lot of vitamins (ie green vegetables, tomatoes, red peppers, etc).  Limit sweet tea, regular sodas and alcoholic beverages, all of which has a lot of calories and sugar.  Up to 1 alcoholic drink daily may be beneficial for women (unless trying to lose weight, watch sugars).  Drink a lot of water.  Calcium recommendations are 1200-1500 mg daily (1500 mg for postmenopausal women or women without ovaries), and vitamin D 1000 IU daily.  This should be obtained from diet and/or supplements (vitamins), and calcium should not be taken all at once, but in divided doses.  Monthly self breast exams and yearly mammograms for women over the age of 64 is recommended.  Sunscreen of at least SPF 30 should be used on all sun-exposed parts of the skin when outside between the hours of 10 am and 4 pm (not just when at beach or pool, but even with exercise, golf, tennis, and yard work!)  Use a sunscreen that says "broad spectrum" so it covers both UVA and UVB rays, and make sure to reapply every 1-2 hours.  Remember to change the batteries in your smoke detectors when changing your clock times in the spring and fall.  Use your seat belt every time you are in a car, and please drive safely and not be distracted with cell phones and texting while driving.    lactose-free diet for 1-2 weeks.  If ongoing bloating and abdominal, consider trial of gluten-free diet. Use simethicone (gas-x) as needed for abdominal  pain.  Increase the nexium to 40mg  daily and if you feel better on this dose, you can call for a prescription for the higher dose (OTC is only 20mg )

## 2012-11-05 ENCOUNTER — Encounter: Payer: Self-pay | Admitting: Family Medicine

## 2012-11-10 ENCOUNTER — Encounter: Payer: Self-pay | Admitting: Family Medicine

## 2012-11-11 ENCOUNTER — Other Ambulatory Visit: Payer: Self-pay | Admitting: *Deleted

## 2012-11-11 DIAGNOSIS — K219 Gastro-esophageal reflux disease without esophagitis: Secondary | ICD-10-CM

## 2012-11-11 MED ORDER — ESOMEPRAZOLE MAGNESIUM 40 MG PO CPDR: 40.0000 mg | DELAYED_RELEASE_CAPSULE | Freq: Every day | ORAL | Status: DC

## 2012-12-08 ENCOUNTER — Encounter: Payer: Self-pay | Admitting: Family Medicine

## 2012-12-09 ENCOUNTER — Other Ambulatory Visit: Payer: Self-pay | Admitting: *Deleted

## 2012-12-09 ENCOUNTER — Encounter: Payer: Self-pay | Admitting: Gastroenterology

## 2012-12-09 DIAGNOSIS — R109 Unspecified abdominal pain: Secondary | ICD-10-CM

## 2012-12-14 ENCOUNTER — Encounter: Payer: Self-pay | Admitting: Gastroenterology

## 2012-12-14 ENCOUNTER — Other Ambulatory Visit: Payer: 59

## 2012-12-14 ENCOUNTER — Ambulatory Visit (INDEPENDENT_AMBULATORY_CARE_PROVIDER_SITE_OTHER): Payer: 59 | Admitting: Gastroenterology

## 2012-12-14 VITALS — BP 128/90 | HR 64 | Ht 65.0 in | Wt 176.8 lb

## 2012-12-14 DIAGNOSIS — R1319 Other dysphagia: Secondary | ICD-10-CM

## 2012-12-14 DIAGNOSIS — K219 Gastro-esophageal reflux disease without esophagitis: Secondary | ICD-10-CM

## 2012-12-14 DIAGNOSIS — R1011 Right upper quadrant pain: Secondary | ICD-10-CM

## 2012-12-14 MED ORDER — HYOSCYAMINE SULFATE 0.125 MG SL SUBL: SUBLINGUAL_TABLET | SUBLINGUAL | Status: DC

## 2012-12-14 NOTE — Progress Notes (Signed)
History of Present Illness: This is a 42 year old female accompanied by her husband. She relates recurrent right upper quadrant pain associated with gas, bloating and radiation of the pain to her right back. The symptoms are not necessarily related to meals but can be brought on by meals but generally occur randomly. In addition she has postprandial heartburn and has had problems with intermittent solid food dysphagia. She was placed on Nexium and her heartburn and dysphagia symptoms have resolved. Her other symptoms have not changed. Abdominal ultrasound performed in March 2014 was unremarkable. Blood work performed in March 2014 was unremarkable. TSH in July 2014 was normal. Denies weight loss, constipation, diarrhea, change in stool caliber, melena, hematochezia, nausea, vomiting, dysphagia,  chest pain.  Review of Systems: Pertinent positive and negative review of systems were noted in the above HPI section. All other review of systems were otherwise negative.  Current Medications, Allergies, Past Medical History, Past Surgical History, Family History and Social History were reviewed in [employer] record.  Physical Exam: General: Well developed , well nourished, no acute distress Head: Normocephalic and atraumatic Eyes:  sclerae anicteric, EOMI Ears: Normal auditory acuity Mouth: No deformity or lesions Neck: Supple, no masses or thyromegaly Lungs: Clear throughout to auscultation Heart: Regular rate and rhythm; no murmurs, rubs or bruits Abdomen: Soft, non tender and non distended. No masses, hepatosplenomegaly or hernias noted. Normal Bowel sounds Musculoskeletal: Symmetrical with no gross deformities  Skin: No lesions on visible extremities Pulses:  Normal pulses noted Extremities: No clubbing, cyanosis, edema or deformities noted Neurological: Alert oriented x 4, grossly nonfocal Cervical Nodes:  No significant cervical adenopathy Inguinal Nodes: No significant  inguinal adenopathy Psychological:  Alert and cooperative. Normal mood and affect  Assessment and Recommendations:  1. RUQ pain, bloating, gas. Schdule CCK HIDA and EGD. Trial of hyoscyamine prn. Continue Gas-X qid prn. Celiac panel. The risks, benefits, and alternatives to endoscopy with possible biopsy and possible dilation were discussed with the patient and they consent to proceed.   2. GERD and dysphagia. Continue Nexium 40 mg daily and antireflux measures. EGD as above.   3. Colorectal cancer screening, average risk. Colonoscopy at age 28.

## 2012-12-14 NOTE — Patient Instructions (Addendum)
Your physician has requested that you go to the basement for the following lab work before leaving today: Celiac panel.  We have sent the following medications to your pharmacy for you to pick up at your convenience: Levsin.  Patient advised to avoid spicy, acidic, citrus, chocolate, mints, fruit and fruit juices.  Limit the intake of caffeine, alcohol and Soda.  Don't exercise too soon after eating.  Don't lie down within 3-4 hours of eating.  Elevate the head of your bed.  Continue Nexium daily.    You have been scheduled for an endoscopy with propofol. Please follow written instructions given to you at your visit today. If you use inhalers (even only as needed), please bring them with you on the day of your procedure. Your physician has requested that you go to www.startemmi.com and enter the access code given to you at your visit today. This web site gives a general overview about your procedure. However, you should still follow specific instructions given to you by our office regarding your preparation for the procedure.   You have been scheduled for a HIDA scan at [facility] (1st floor) on 12/29/12. Please arrive 15 minutes prior to your scheduled appointment on 0:45WU. Make certain not to have anything to eat or drink at least 6 hours prior to your test. Should this appointment date or time not work well for you, please call radiology scheduling at [phone].  _____________________________________________________________________ hepatobiliary (HIDA) scan is an imaging procedure used to diagnose problems in the liver, gallbladder and bile ducts. In the HIDA scan, a radioactive chemical or tracer is injected into a vein in your arm. The tracer is handled by the liver like bile. Bile is a fluid produced and excreted by your liver that helps your digestive system break down fats in the foods you eat. Bile is stored in your gallbladder and the gallbladder releases the bile when you eat  a meal. A special nuclear medicine scanner (gamma camera) tracks the flow of the tracer from your liver into your gallbladder and small intestine.  During your HIDA scan  You'll be asked to change into a hospital gown before your HIDA scan begins. Your health care team will position you on a table, usually on your back. The radioactive tracer is then injected into a vein in your arm.The tracer travels through your bloodstream to your liver, where it's taken up by the bile-producing cells. The radioactive tracer travels with the bile from your liver into your gallbladder and through your bile ducts to your small intestine.You may feel some pressure while the radioactive tracer is injected into your vein. As you lie on the table, a special gamma camera is positioned over your abdomen taking pictures of the tracer as it moves through your body. The gamma camera takes pictures continually for about an hour. You'll need to keep still during the HIDA scan. This can become uncomfortable, but you may find that you can lessen the discomfort by taking deep breaths and thinking about other things. Tell your health care team if you're uncomfortable. The radiologist will watch on a computer the progress of the radioactive tracer through your body. The HIDA scan may be stopped when the radioactive tracer is seen in the gallbladder and enters your small intestine. This typically takes about an hour. In some cases extra imaging will be performed if original images aren't satisfactory, if morphine is given to help visualize the gallbladder or if the medication CCK is given to look  at the contraction of the gallbladder. This test typically takes 2 hours to complete. ________________________________________________________________________   cc: Joselyn Arrow, MD

## 2012-12-15 ENCOUNTER — Encounter: Payer: Self-pay | Admitting: Gastroenterology

## 2012-12-15 ENCOUNTER — Ambulatory Visit (AMBULATORY_SURGERY_CENTER): Payer: 59 | Admitting: Gastroenterology

## 2012-12-15 VITALS — BP 126/73 | HR 63 | Temp 97.7°F | Resp 21 | Ht 65.0 in | Wt 176.0 lb

## 2012-12-15 DIAGNOSIS — R1011 Right upper quadrant pain: Secondary | ICD-10-CM

## 2012-12-15 DIAGNOSIS — R1319 Other dysphagia: Secondary | ICD-10-CM

## 2012-12-15 DIAGNOSIS — K222 Esophageal obstruction: Secondary | ICD-10-CM

## 2012-12-15 DIAGNOSIS — K2970 Gastritis, unspecified, without bleeding: Secondary | ICD-10-CM

## 2012-12-15 DIAGNOSIS — K219 Gastro-esophageal reflux disease without esophagitis: Secondary | ICD-10-CM

## 2012-12-15 DIAGNOSIS — D131 Benign neoplasm of stomach: Secondary | ICD-10-CM

## 2012-12-15 DIAGNOSIS — K2990 Gastroduodenitis, unspecified, without bleeding: Secondary | ICD-10-CM

## 2012-12-15 HISTORY — PX: ESOPHAGOGASTRODUODENOSCOPY: SHX1529

## 2012-12-15 LAB — CELIAC PANEL 10
Tissue Transglut Ab: 4 U/mL (ref <20)
Tissue Transglutaminase Ab, IgA: 3.3 U/mL (ref <20)

## 2012-12-15 MED ORDER — SODIUM CHLORIDE 0.9 % IV SOLN: 500.0000 mL | INTRAVENOUS | Status: DC

## 2012-12-15 NOTE — Progress Notes (Signed)
Called to room to assist during endoscopic procedure.  Patient ID and intended procedure confirmed with present staff. Received instructions for my participation in the procedure from the performing physician.  

## 2012-12-15 NOTE — Progress Notes (Signed)
Report to pacu rn, vss, bbs=clear 

## 2012-12-15 NOTE — Progress Notes (Signed)
Patient did not experience any of the following events: a burn prior to discharge; a fall within the facility; wrong site/side/patient/procedure/implant event; or a hospital transfer or hospital admission upon discharge from the facility. (G8907) Patient did not have preoperative order for IV antibiotic SSI prophylaxis. (G8918)  

## 2012-12-15 NOTE — Op Note (Signed)
[facility] [address] [place], [address]   ENDOSCOPY PROCEDURE REPORT  PATIENT: Robin Parks, Robin Parks  MR#: [id number] BIRTHDATE: 1970/11/13 , 42  yrs. old GENDER: Female ENDOSCOPIST: Meryl Dare, MD, [facility] REFERRED BY:  Joselyn Arrow, M.D. PROCEDURE DATE:  12/15/2012 PROCEDURE:  EGD w/ biopsy and Savary dilation of esophagus ASA CLASS:     Class II INDICATIONS:  abdominal pain in the upper right quadrant. Dysphagia. MEDICATIONS: MAC sedation, administered by CRNA and propofol (Diprivan) 150mg  IV TOPICAL ANESTHETIC: Cetacaine Spray DESCRIPTION OF PROCEDURE: After the risks benefits and alternatives of the procedure were thoroughly explained, informed consent was obtained.  The LB [id number] endoscope was introduced through the mouth and advanced to the second portion of the duodenum without limitations.  The instrument was slowly withdrawn as the mucosa was fully examined.  ESOPHAGUS: A subtle stricture was found at the gastroesophageal junction. The esophagus was otherwise normal. STOMACH: Mild gastritis (erythema) was found in the gastric body and gastric antrum. Multiple biopsies were performed. The stomach otherwise appeared normal. DUODENUM: The duodenal mucosa showed no abnormalities in the bulb and second portion of the duodenum.  Retroflexed views revealed no abnormalities. A guidewire was placea and the scope was then withdrawn from the patient. 17 mm Savary dilator was passed with mininal resistance and a trace of heme. The guidewire and dilator were removed and the procedure completed.  COMPLICATIONS: There were no complications.  ENDOSCOPIC IMPRESSION: 1.   Stricture at the gastroesophageal junction 2.   Mild gastritis in the gastric body and gastric antrum; multiple biopsies  RECOMMENDATIONS: 1.  Anti-reflux regimen 2.  Await pathology results 3.  Continue PPI 4.  Post dilation instructions   eSigned:  Meryl Dare, MD, [facility]  12/15/2012 8:59 AM

## 2012-12-15 NOTE — Patient Instructions (Addendum)
YOU HAD AN ENDOSCOPIC PROCEDURE TODAY AT THE [facility]: Refer to the procedure report that was given to you for any specific questions about what was found during the examination.  If the procedure report does not answer your questions, please call your gastroenterologist to clarify.  If you requested that your care partner not be given the details of your procedure findings, then the procedure report has been included in a sealed envelope for you to review at your convenience later.  YOU SHOULD EXPECT: Some feelings of bloating in the abdomen. Passage of more gas than usual.  Walking can help get rid of the air that was put into your GI tract during the procedure and reduce the bloating. If you had a lower endoscopy (such as a colonoscopy or flexible sigmoidoscopy) you may notice spotting of blood in your stool or on the toilet paper. If you underwent a bowel prep for your procedure, then you may not have a normal bowel movement for a few days.  DIET: Follow Dilation handout.  ACTIVITY: Your care partner should take you home directly after the procedure.  You should plan to take it easy, moving slowly for the rest of the day.  You can resume normal activity the day after the procedure however you should NOT DRIVE or use heavy machinery for 24 hours (because of the sedation medicines used during the test).    SYMPTOMS TO REPORT IMMEDIATELY: A gastroenterologist can be reached at any hour.  During normal business hours, 8:30 AM to 5:00 PM Monday through Friday, call [phone].  After hours and on weekends, please call the GI answering service at [phone] who will take a message and have the physician on call contact you.    Following upper endoscopy (EGD)  Vomiting of blood or coffee ground material  New chest pain or pain under the shoulder blades  Painful or persistently difficult swallowing  New shortness of breath  Fever of 100F or higher  Black, tarry-looking  stools  FOLLOW UP: If any biopsies were taken you will be contacted by phone or by letter within the next 1-3 weeks.  Call your gastroenterologist if you have not heard about the biopsies in 3 weeks.  Our staff will call the home number listed on your records the next business day following your procedure to check on you and address any questions or concerns that you may have at that time regarding the information given to you following your procedure. This is a courtesy call and so if there is no answer at the home number and we have not heard from you through the emergency physician on call, we will assume that you have returned to your regular daily activities without incident.  SIGNATURES/CONFIDENTIALITY: You and/or your care partner have signed paperwork which will be entered into your electronic medical record.  These signatures attest to the fact that that the information above on your After Visit Summary has been reviewed and is understood.  Full responsibility of the confidentiality of this discharge information lies with you and/or your care-partner.  Resume medications. Information given on Gerd and gastritis and dilation diet with discharge instructions.

## 2012-12-16 ENCOUNTER — Telehealth: Payer: Self-pay

## 2012-12-16 NOTE — Telephone Encounter (Signed)
  Follow up Call-  Call back number 12/15/2012  Post procedure Call Back phone  # [phone]  Permission to leave phone message Yes     Patient questions:  Do you have a fever, pain , or abdominal swelling? no Pain Score  0 *  Have you tolerated food without any problems? yes  Have you been able to return to your normal activities? yes  Do you have any questions about your discharge instructions: Diet   no Medications  no Follow up visit  no  Do you have questions or concerns about your Care? no  Actions: * If pain score is 4 or above: No action needed, pain <4.  Per the pt, "I felt loopy last night".  She said she was not sure if it was from the sedation or new gall bladder medication.  I advised her to call us back if that sx continues.  She said she would. maw

## 2012-12-21 ENCOUNTER — Encounter: Payer: Self-pay | Admitting: Gastroenterology

## 2012-12-24 ENCOUNTER — Encounter: Payer: Self-pay | Admitting: Internal Medicine

## 2012-12-28 ENCOUNTER — Encounter: Payer: Self-pay | Admitting: Family Medicine

## 2012-12-29 ENCOUNTER — Encounter (HOSPITAL_COMMUNITY)
Admission: RE | Admit: 2012-12-29 | Discharge: 2012-12-29 | Disposition: A | Discharge status: Home / Self Care | Payer: 59 | Source: Ambulatory Visit | Attending: Gastroenterology | Admitting: Gastroenterology

## 2012-12-29 DIAGNOSIS — R109 Unspecified abdominal pain: Secondary | ICD-10-CM | POA: Insufficient documentation

## 2012-12-29 DIAGNOSIS — R1011 Right upper quadrant pain: Secondary | ICD-10-CM

## 2012-12-29 MED ORDER — TECHNETIUM TC 99M MEBROFENIN IV KIT
5.5000 | PACK | Freq: Once | INTRAVENOUS | Status: AC | PRN
  Administered 2012-12-29: 6 via INTRAVENOUS

## 2013-01-11 ENCOUNTER — Encounter: Payer: BC Managed Care – PPO | Admitting: Family Medicine

## 2013-02-11 ENCOUNTER — Other Ambulatory Visit: Payer: Self-pay

## 2013-03-09 ENCOUNTER — Encounter: Payer: Self-pay | Admitting: Family Medicine

## 2013-03-31 ENCOUNTER — Encounter: Payer: Self-pay | Admitting: Gastroenterology

## 2013-03-31 ENCOUNTER — Ambulatory Visit (INDEPENDENT_AMBULATORY_CARE_PROVIDER_SITE_OTHER): Payer: 59 | Admitting: Gastroenterology

## 2013-03-31 VITALS — BP 120/78 | HR 64 | Ht 65.0 in | Wt 181.2 lb

## 2013-03-31 DIAGNOSIS — R109 Unspecified abdominal pain: Secondary | ICD-10-CM

## 2013-03-31 DIAGNOSIS — M25559 Pain in unspecified hip: Secondary | ICD-10-CM

## 2013-03-31 DIAGNOSIS — K5900 Constipation, unspecified: Secondary | ICD-10-CM

## 2013-03-31 MED ORDER — PEG-KCL-NACL-NASULF-NA ASC-C 100 G PO SOLR: 1.0000 | Freq: Once | ORAL | Status: DC

## 2013-03-31 NOTE — Patient Instructions (Signed)
You have been scheduled for a colonoscopy with propofol. Please follow written instructions given to you at your visit today.  Please pick up your prep kit at the pharmacy within the next 1-3 days. If you use inhalers (even only as needed), please bring them with you on the day of your procedure.  Increase your water intake. You can use Citrucel Clear over the counter once daily for constipation.   Use your Levsin as needed for lower abdominal cramping also.  High-Fiber Diet Fiber is found in fruits, vegetables, and grains. A high-fiber diet encourages the addition of more whole grains, legumes, fruits, and vegetables in your diet. The recommended amount of fiber for adult males is 38 g per day. For adult females, it is 25 g per day. Pregnant and lactating women should get 28 g of fiber per day. If you have a digestive or bowel problem, ask your caregiver for advice before adding high-fiber foods to your diet. Eat a variety of high-fiber foods instead of only a select few type of foods.  PURPOSE  To increase stool bulk.  To make bowel movements more regular to prevent constipation.  To lower cholesterol.  To prevent overeating. WHEN IS THIS DIET USED?  It may be used if you have constipation and hemorrhoids.  It may be used if you have uncomplicated diverticulosis (intestine condition) and irritable bowel syndrome.  It may be used if you need help with weight management.  It may be used if you want to add it to your diet as a protective measure against atherosclerosis, diabetes, and cancer. SOURCES OF FIBER  Whole-grain breads and cereals.  Fruits, such as apples, oranges, bananas, berries, prunes, and pears.  Vegetables, such as green peas, carrots, sweet potatoes, beets, broccoli, cabbage, spinach, and artichokes.  Legumes, such split peas, soy, lentils.  Almonds. FIBER CONTENT IN FOODS Starches and Grains / Dietary Fiber (g)  Cheerios, 1 cup / 3 g  Corn Flakes cereal, 1  cup / 0.7 g  Rice crispy treat cereal, 1 cup / 0.3 g  Instant oatmeal (cooked),  cup / 2 g  Frosted wheat cereal, 1 cup / 5.1 g  Brown, long-grain rice (cooked), 1 cup / 3.5 g  White, long-grain rice (cooked), 1 cup / 0.6 g  Enriched macaroni (cooked), 1 cup / 2.5 g Legumes / Dietary Fiber (g)  Baked beans (canned, plain, or vegetarian),  cup / 5.2 g  Kidney beans (canned),  cup / 6.8 g  Pinto beans (cooked),  cup / 5.5 g Breads and Crackers / Dietary Fiber (g)  Plain or honey graham crackers, 2 squares / 0.7 g  Saltine crackers, 3 squares / 0.3 g  Plain, salted pretzels, 10 pieces / 1.8 g  Whole-wheat bread, 1 slice / 1.9 g  White bread, 1 slice / 0.7 g  Raisin bread, 1 slice / 1.2 g  Plain bagel, 3 oz / 2 g  Flour tortilla, 1 oz / 0.9 g  Corn tortilla, 1 small / 1.5 g  Hamburger or hotdog bun, 1 small / 0.9 g Fruits / Dietary Fiber (g)  Apple with skin, 1 medium / 4.4 g  Sweetened applesauce,  cup / 1.5 g  Banana,  medium / 1.5 g  Grapes, 10 grapes / 0.4 g  Orange, 1 small / 2.3 g  Raisin, 1.5 oz / 1.6 g  Melon, 1 cup / 1.4 g Vegetables / Dietary Fiber (g)  Green beans (canned),  cup / 1.3 g  Carrots (cooked),  cup / 2.3 g  Broccoli (cooked),  cup / 2.8 g  Peas (cooked),  cup / 4.4 g  Mashed potatoes,  cup / 1.6 g  Lettuce, 1 cup / 0.5 g  Corn (canned),  cup / 1.6 g  Tomato,  cup / 1.1 g Document Released: 03/25/2005 Document Revised: 09/24/2011 Document Reviewed: 06/27/2011 [facility] Patient Information 2014 [place], [place].  Thank you for choosing me and [facility].  Venita Lick. Pleas Koch., MD., Clementeen Graham

## 2013-03-31 NOTE — Progress Notes (Signed)
    History of Present Illness: This is a 42 year old female accompanied by her husband. I saw her earlier this year for right upper quadrant pain, bloating, esophageal reflux and dysphasia. Her reflux symptoms and upper abdominal complaints have resolved. Today she complains about a one-year history of low pelvic pain that radiates to her back. She states that the symptoms began following TAH/BSO by Dr. Tenny Craw in December 2013. Surgery was performed for chronic pelvic pain, endometriosis, dysmenorrhea, and dyspareunia. Pelvic MRI performed in July 2012 showed a bulging disc at L5-S1. RUQ ultrasound performed in March 2014 was unremarkable. CCK HIDA in September of 2014 was unremarkable. EGD in September 2014 showed an esophagogastric junction stricture and mild gastritis and she was dilated. She relates occasional mild constipation with bloating. She has not tried using hyoscyamine pelvic/lower abdominal complaints. Denies weight loss, diarrhea, change in stool caliber, melena, hematochezia, nausea, vomiting, dysphagia, reflux symptoms, chest pain.   Current Medications, Allergies, Past Medical History, Past Surgical History, Family History and Social History were reviewed in [employer] record.  Physical Exam: General: Well developed , well nourished, she became tearful during the interview, otherwise no acute distress Head: Normocephalic and atraumatic Eyes:  sclerae anicteric, EOMI Ears: Normal auditory acuity Mouth: No deformity or lesions Lungs: Clear throughout to auscultation Heart: Regular rate and rhythm; no murmurs, rubs or bruits Abdomen: Soft, mild tenderness bilaterally in her pelvic area and groin and non distended. No masses, hepatosplenomegaly or hernias noted. Normal Bowel sounds Rectal: Deferred to colonoscopy Musculoskeletal: Symmetrical with no gross deformities  Pulses:  Normal pulses noted Extremities: No clubbing, cyanosis, edema or deformities  noted Neurological: Alert oriented x 4, grossly nonfocal Psychological:  Alert and cooperative. Normal mood and affect  Assessment and Recommendations:  1. Chronic pelvic pain occasionally radiating to her back. This does not appear to be gastrointestinal in etiology. Given her mild constipation and chronic pelvic pain we will proceed with colonoscopy to rule out gastrointestinal causes. The risks, benefits, and alternatives to colonoscopy with possible biopsy and possible polypectomy were discussed with the patient and they consent to proceed. She is advised to increase her dietary fiber and water intake. Use hyoscyamine as needed for pelvic symptoms to determine if this is helpful. She may need further evaluation of her lumbosacral spine for worsening disc disease and radicular pain. Her pelvic pain may be related to adhesions from her TAH/BSO but her pelvic complaints apparently preceded her TAH/BSO.   2. GERD with history of esophageal stricture. Continue standard antireflux measures and Nexium 40 mg daily.

## 2013-04-05 ENCOUNTER — Encounter: Payer: Self-pay | Admitting: Gastroenterology

## 2013-05-27 ENCOUNTER — Encounter: Payer: Self-pay | Admitting: Gastroenterology

## 2013-05-27 ENCOUNTER — Ambulatory Visit (AMBULATORY_SURGERY_CENTER): Payer: 59 | Admitting: Gastroenterology

## 2013-05-27 VITALS — BP 116/84 | HR 66 | Temp 98.0°F | Resp 23 | Ht 65.0 in | Wt 181.0 lb

## 2013-05-27 DIAGNOSIS — R109 Unspecified abdominal pain: Secondary | ICD-10-CM

## 2013-05-27 DIAGNOSIS — D126 Benign neoplasm of colon, unspecified: Secondary | ICD-10-CM

## 2013-05-27 DIAGNOSIS — K5900 Constipation, unspecified: Secondary | ICD-10-CM

## 2013-05-27 MED ORDER — SODIUM CHLORIDE 0.9 % IV SOLN: 500.0000 mL | INTRAVENOUS | Status: DC

## 2013-05-27 NOTE — Progress Notes (Signed)
Called to room to assist during endoscopic procedure.  Patient ID and intended procedure confirmed with present staff. Received instructions for my participation in the procedure from the performing physician.  

## 2013-05-27 NOTE — Progress Notes (Signed)
Stable to RR 

## 2013-05-27 NOTE — Op Note (Signed)
[facility]  [address] [place], [address]   COLONOSCOPY PROCEDURE REPORT  PATIENT: Robin Parks, Robin Parks  MR#: [id number] BIRTHDATE: [birth year] , 42  yrs. old GENDER: Female ENDOSCOPIST: Ladene Artist, MD, [facility] REFERRED Terrall Laity, M.D. PROCEDURE DATE:  05/27/2013 PROCEDURE:   Colonoscopy with snare polypectomy First Screening Colonoscopy - Avg.  risk and is 50 yrs.  old or older - No.  Prior Negative Screening - Now for repeat screening. N/A  History of Adenoma - Now for follow-up colonoscopy & has been > or = to 3 yrs.  N/A  Polyps Removed Today? Yes. ASA CLASS:   Class II INDICATIONS: Abdominal pain/pelvic pain and Constipation. MEDICATIONS: MAC sedation, administered by CRNA and propofol (Diprivan) 300mg  IV DESCRIPTION OF PROCEDURE:   After the risks benefits and alternatives of the procedure were thoroughly explained, informed consent was obtained.  A digital rectal exam revealed no abnormalities of the rectum.   The LB [id number]  endoscope was introduced through the anus and advanced to the terminal ileum which was intubated for a short distance. No adverse events experienced.   The quality of the prep was excellent, using MoviPrep  The instrument was then slowly withdrawn as the colon was fully examined.  COLON FINDINGS: A sessile polyp measuring 7 mm in size was found at the cecum.  A polypectomy was performed using snare cautery.  The resection was complete and the polyp tissue was completely retrieved.   The colon was otherwise normal.  There was no diverticulosis, inflammation, polyps or cancers unless previously stated.   The mucosa appeared normal in the terminal ileum. Retroflexed views revealed no abnormalities. The time to cecum=1 minutes 48 seconds.  Withdrawal time=10 minutes 50 seconds.  The scope was withdrawn and the procedure completed. COMPLICATIONS: There were no complications.  ENDOSCOPIC IMPRESSION: 1.   Sessile polyp  measuring 7 mm at the cecum; polypectomy performed using snare cautery 2.   Normal mucosa in the terminal ileum  RECOMMENDATIONS: 1.  Await pathology results 2.  Hold aspirin, aspirin products, and anti-inflammatory medication for 2 weeks. 3.  Repeat colonoscopy in 5 years if polyp adenomatous; otherwise 10 years 4.  No GI cause for pelvic pain/abdominal pain-return to PCP and GYN for further care  eSigned:  Ladene Artist, MD, [facility] 05/27/2013 11:22 AM

## 2013-05-27 NOTE — Patient Instructions (Signed)
Discharge instructions given with verbal understanding. Handout on polyp. Hold aspirin and aspirin products for 2 weeks. Resume previous medications. YOU HAD AN ENDOSCOPIC PROCEDURE TODAY AT [facility]: Refer to the procedure report that was given to you for any specific questions about what was found during the examination.  If the procedure report does not answer your questions, please call your gastroenterologist to clarify.  If you requested that your care partner not be given the details of your procedure findings, then the procedure report has been included in a sealed envelope for you to review at your convenience later.  YOU SHOULD EXPECT: Some feelings of bloating in the abdomen. Passage of more gas than usual.  Walking can help get rid of the air that was put into your GI tract during the procedure and reduce the bloating. If you had a lower endoscopy (such as a colonoscopy or flexible sigmoidoscopy) you may notice spotting of blood in your stool or on the toilet paper. If you underwent a bowel prep for your procedure, then you may not have a normal bowel movement for a few days.  DIET: Your first meal following the procedure should be a light meal and then it is ok to progress to your normal diet.  A half-sandwich or bowl of soup is an example of a good first meal.  Heavy or fried foods are harder to digest and may make you feel nauseous or bloated.  Likewise meals heavy in dairy and vegetables can cause extra gas to form and this can also increase the bloating.  Drink plenty of fluids but you should avoid alcoholic beverages for 24 hours.  ACTIVITY: Your care partner should take you home directly after the procedure.  You should plan to take it easy, moving slowly for the rest of the day.  You can resume normal activity the day after the procedure however you should NOT DRIVE or use heavy machinery for 24 hours (because of the sedation medicines used during the test).     SYMPTOMS TO REPORT IMMEDIATELY: A gastroenterologist can be reached at any hour.  During normal business hours, 8:30 AM to 5:00 PM Monday through Friday, call [phone].  After hours and on weekends, please call the GI answering service at [phone] who will take a message and have the physician on call contact you.   Following lower endoscopy (colonoscopy or flexible sigmoidoscopy):  Excessive amounts of blood in the stool  Significant tenderness or worsening of abdominal pains  Swelling of the abdomen that is new, acute  Fever of 100F or higher FOLLOW UP: If any biopsies were taken you will be contacted by phone or by letter within the next 1-3 weeks.  Call your gastroenterologist if you have not heard about the biopsies in 3 weeks.  Our staff will call the home number listed on your records the next business day following your procedure to check on you and address any questions or concerns that you may have at that time regarding the information given to you following your procedure. This is a courtesy call and so if there is no answer at the home number and we have not heard from you through the emergency physician on call, we will assume that you have returned to your regular daily activities without incident.  SIGNATURES/CONFIDENTIALITY: You and/or your care partner have signed paperwork which will be entered into your electronic medical record.  These signatures attest to the fact that that the information above  on your After Visit Summary has been reviewed and is understood.  Full responsibility of the confidentiality of this discharge information lies with you and/or your care-partner.

## 2013-05-28 ENCOUNTER — Telehealth: Payer: Self-pay

## 2013-05-28 NOTE — Telephone Encounter (Signed)
No answer, left message to call [facility] if questions or concerns following procedure on Thursday.

## 2013-06-01 ENCOUNTER — Encounter: Payer: Self-pay | Admitting: Gastroenterology

## 2013-07-21 ENCOUNTER — Encounter: Payer: Self-pay | Admitting: Family Medicine

## 2014-01-12 ENCOUNTER — Other Ambulatory Visit: Payer: Self-pay | Admitting: Obstetrics and Gynecology

## 2014-01-13 LAB — CYTOLOGY - PAP

## 2014-01-14 ENCOUNTER — Other Ambulatory Visit: Payer: Self-pay | Admitting: Obstetrics and Gynecology

## 2014-01-14 DIAGNOSIS — R928 Other abnormal and inconclusive findings on diagnostic imaging of breast: Secondary | ICD-10-CM

## 2014-01-19 ENCOUNTER — Other Ambulatory Visit: Payer: 59

## 2014-01-19 ENCOUNTER — Ambulatory Visit
Admission: RE | Admit: 2014-01-19 | Discharge: 2014-01-19 | Disposition: A | Discharge status: Home / Self Care | Payer: 59 | Source: Ambulatory Visit | Attending: Obstetrics and Gynecology | Admitting: Obstetrics and Gynecology

## 2014-01-19 DIAGNOSIS — R928 Other abnormal and inconclusive findings on diagnostic imaging of breast: Secondary | ICD-10-CM

## 2014-01-24 ENCOUNTER — Other Ambulatory Visit: Payer: 59

## 2014-08-01 ENCOUNTER — Other Ambulatory Visit: Payer: Self-pay | Admitting: Obstetrics and Gynecology

## 2014-08-01 DIAGNOSIS — D241 Benign neoplasm of right breast: Secondary | ICD-10-CM

## 2014-08-03 ENCOUNTER — Ambulatory Visit
Admission: RE | Admit: 2014-08-03 | Discharge: 2014-08-03 | Disposition: A | Discharge status: Home / Self Care | Payer: No Typology Code available for payment source | Source: Ambulatory Visit | Attending: Obstetrics and Gynecology | Admitting: Obstetrics and Gynecology

## 2014-08-03 DIAGNOSIS — D241 Benign neoplasm of right breast: Secondary | ICD-10-CM

## 2015-02-02 IMAGING — US US ABDOMEN LIMITED
1 series · 14 of 25 positions shown · non-contrast
Comparison: None.

CLINICAL DATA: Right upper quadrant pain.

LIMITED ABDOMINAL ULTRASOUND - RIGHT UPPER QUADRANT

[Series 1: us abdomen limited · 0.26mm/px · 14 of 38 slices shown]
[im 1/38]
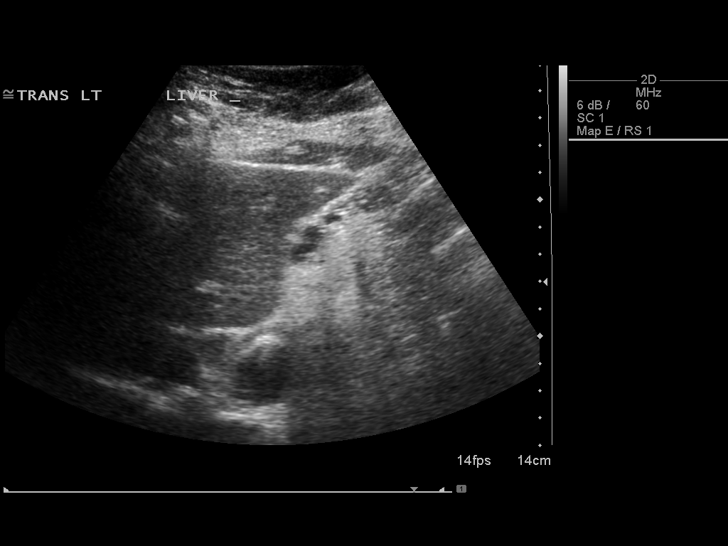
[im 4/38]
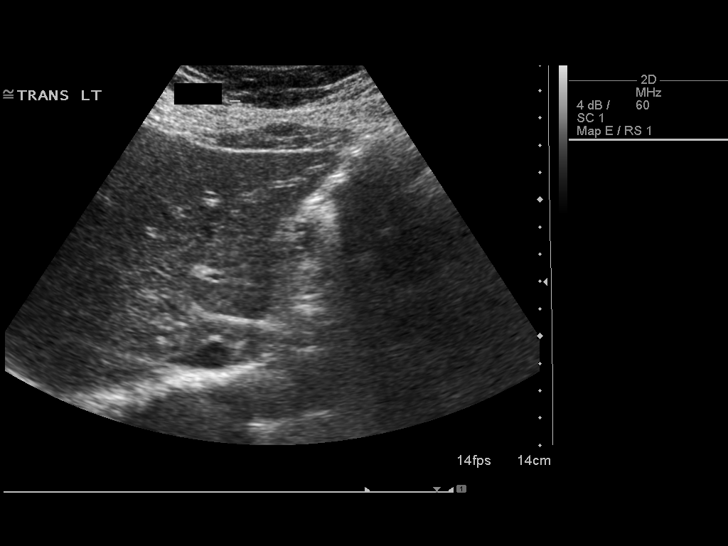
[im 7/38]
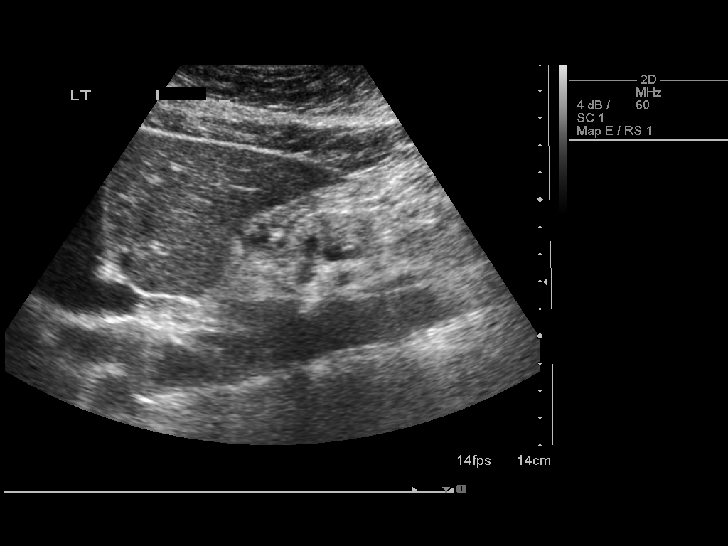
[im 10/38]
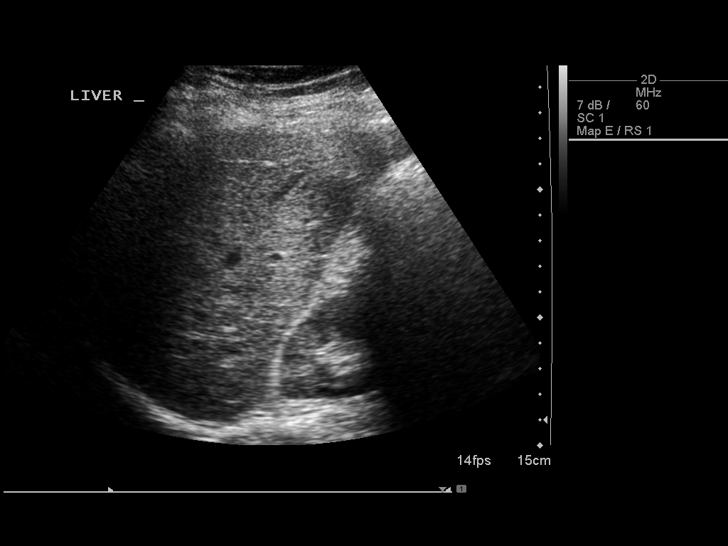
[im 13/38]
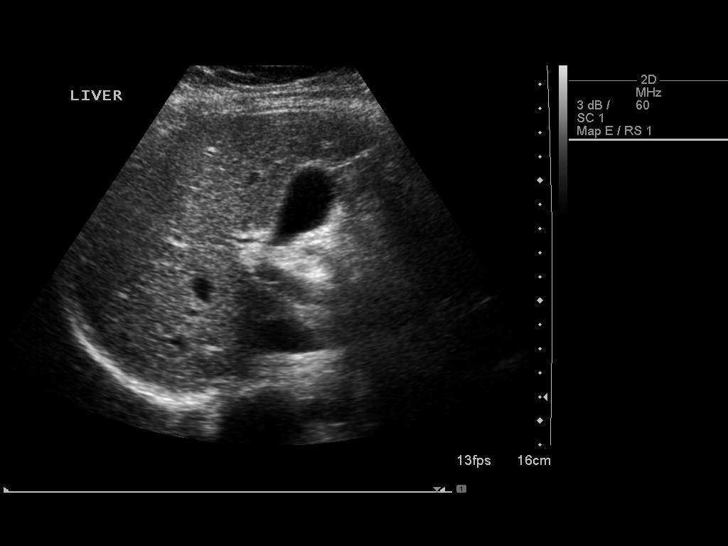
[im 14/38]
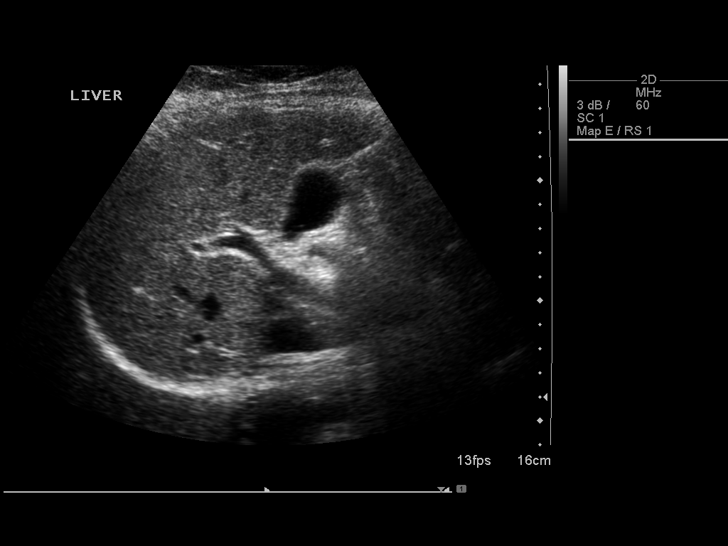
[im 17/38]
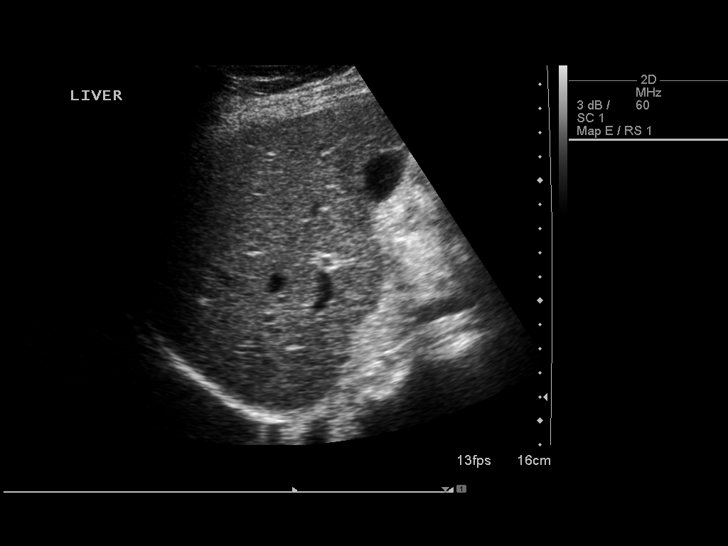
[im 21/38]
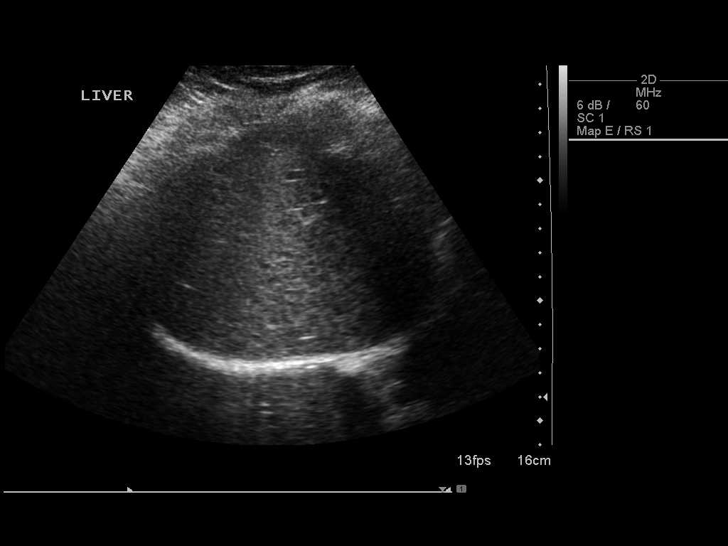
[im 24/38]
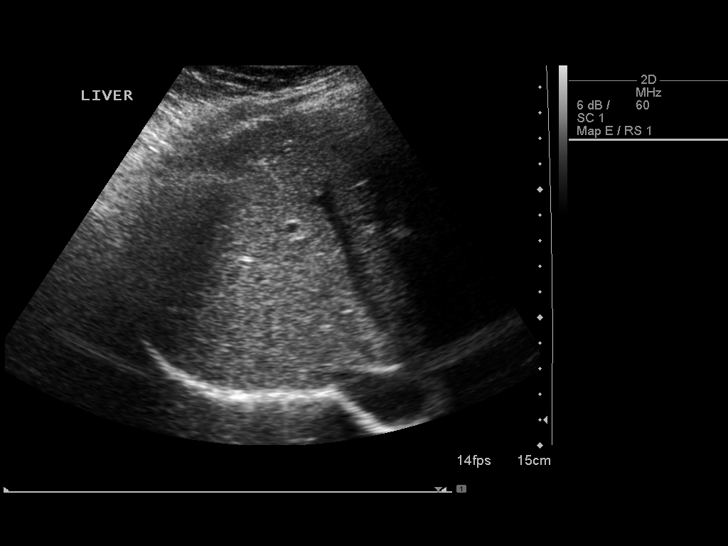
[im 25/38]
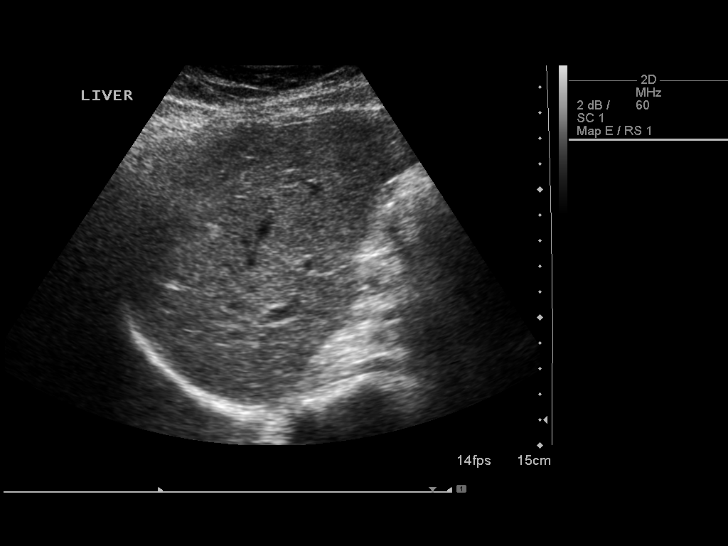
[im 28/38]
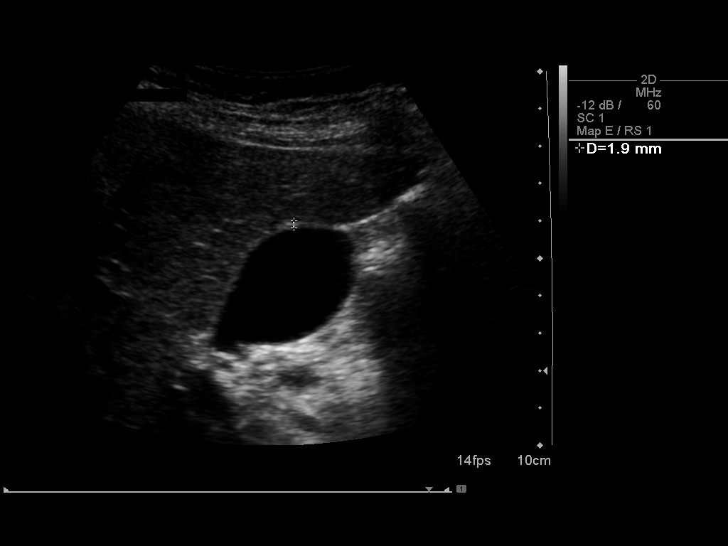
[im 31/38]
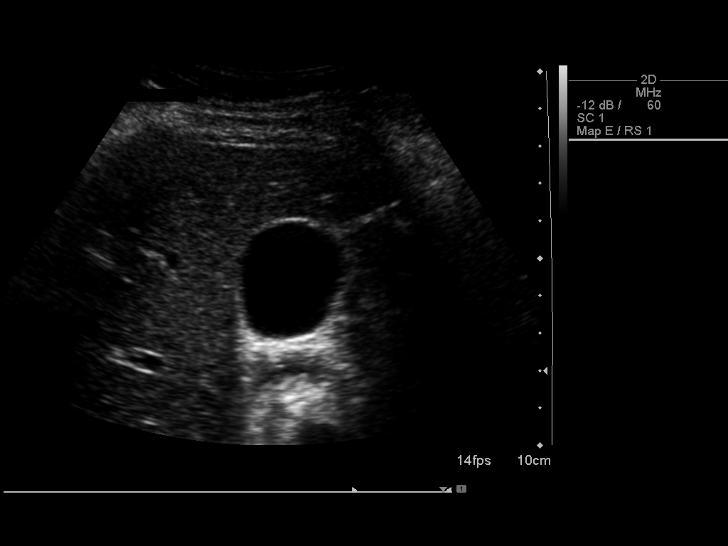
[im 34/38]
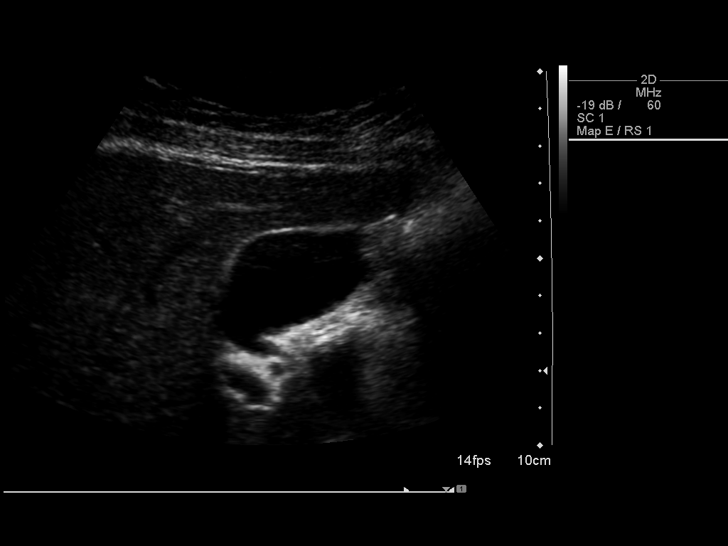
[im 38/38]
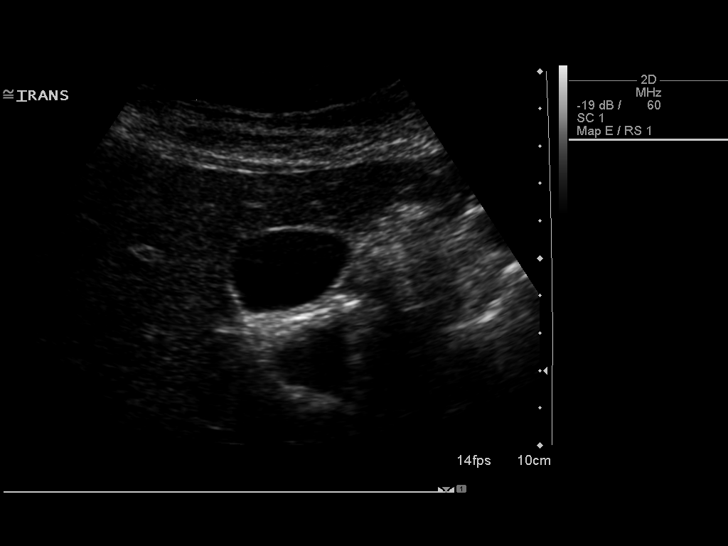

[14 of 25 positions shown; findings below may reference images not displayed]

FINDINGS: Gallbladder:  No gallstones, wall thickening or sonographic
Murphy's sign.

Common bile duct:  Measures 4 mm, within normal limits.

Liver:  Normal in parenchymal echogenicity.  No focal lesions.
IMPRESSION: Negative.

## 2015-08-17 ENCOUNTER — Other Ambulatory Visit: Payer: Self-pay

## 2015-08-17 DIAGNOSIS — Z1231 Encounter for screening mammogram for malignant neoplasm of breast: Secondary | ICD-10-CM

## 2015-08-21 ENCOUNTER — Ambulatory Visit
Admission: RE | Admit: 2015-08-21 | Discharge: 2015-08-21 | Disposition: A | Discharge status: Home / Self Care | Payer: No Typology Code available for payment source | Source: Ambulatory Visit

## 2015-08-21 DIAGNOSIS — Z1231 Encounter for screening mammogram for malignant neoplasm of breast: Secondary | ICD-10-CM

## 2016-05-16 ENCOUNTER — Encounter (INDEPENDENT_AMBULATORY_CARE_PROVIDER_SITE_OTHER): Payer: Self-pay | Admitting: Physician Assistant

## 2016-05-16 ENCOUNTER — Ambulatory Visit (INDEPENDENT_AMBULATORY_CARE_PROVIDER_SITE_OTHER): Payer: Self-pay

## 2016-05-16 ENCOUNTER — Ambulatory Visit (INDEPENDENT_AMBULATORY_CARE_PROVIDER_SITE_OTHER): Payer: Self-pay | Admitting: Physician Assistant

## 2016-05-16 DIAGNOSIS — M65311 Trigger thumb, right thumb: Secondary | ICD-10-CM

## 2016-05-16 DIAGNOSIS — M79644 Pain in right finger(s): Secondary | ICD-10-CM

## 2016-05-16 MED ORDER — LIDOCAINE HCL 1 % IJ SOLN
0.5000 mL | INTRAMUSCULAR | Status: AC | PRN
  Administered 2016-05-16: .5 mL

## 2016-05-16 MED ORDER — METHYLPREDNISOLONE ACETATE 40 MG/ML IJ SUSP
20.0000 mg | INTRAMUSCULAR | Status: AC | PRN
  Administered 2016-05-16: 20 mg

## 2016-05-16 NOTE — Progress Notes (Signed)
Office Visit Note   Patient: Robin Parks           Date of Birth: 05-16-1970           MRN: [id number] Visit Date: 05/16/2016              Requested by: Rita Ohara, MD [address] [place], [place] [address] PCP: Vikki Ports, MD   Assessment & Plan: Visit Diagnoses:  1. Pain of right thumb   2. Trigger thumb, right thumb     Plan: Follow-up on an as-needed basis or if triggering of the thumb recurs  Follow-Up Instructions: Return if symptoms worsen or fail to improve.   Orders:  Orders Placed This Encounter  Procedures  . Hand/Upper Extremity Injection/Arthrocentesis  . XR Finger Thumb Right   No orders of the defined types were placed in this encounter.     Procedures: Hand/UE Inj Date/Time: 05/16/2016 3:18 PM Performed by: Pete Pelt Authorized by: Pete Pelt   Condition: trigger finger   Location:  Thumb Site:  R thumb A1 Needle Size:  25 G Approach:  Volar Ultrasound Guidance: No   Medications:  20 mg methylPREDNISolone acetate 40 MG/ML; 0.5 mL lidocaine 1 %     Clinical Data: No additional findings.   Subjective: Chief Complaint  Patient presents with  . Right Hand - Pain    HPI Mrs. being comes in today stating that she is having pain in her right thumb past months. Anus getting worse to the point that she can't bend the thumb some swelling in the thumb also. No known injury. Review of Systems   Objective: Vital Signs: LMP 01/21/2012   Physical Exam  Constitutional: She appears well-developed and well-nourished. No distress.    Ortho Exam Right thumb actively triggering. Tenderness over the A1 pulley. No other triggering the remainder of the fingers right hand. Radial pulses 2+ and sensation intact throughout the hand to light touch Specialty Comments:  No specialty comments available.  Imaging: Xr Finger Thumb Right  Result Date: 05/16/2016 Right thumb 2 views: No acute fractures no bony abnormalities. CMC joint and  PIP joints well-maintained.    PMFS History: Patient Active Problem List   Diagnosis Date Noted  . Abdominal pain, generalized 11/04/2012  . Back pain, thoracic 06/24/2012  . Abdominal pain, RUQ 06/24/2012  . Anxiety state, unspecified 06/12/2012  . GERD (gastroesophageal reflux disease) 06/12/2012   Past Medical History:  Diagnosis Date  . Anxiety    PANIC ATTACKS  . Chronic lower back pain    no meds  . Endometriosis   . Esophageal stricture   . GERD (gastroesophageal reflux disease)   . PONV (postoperative nausea and vomiting)   . SVD (spontaneous vaginal delivery)    x 2    Family History  Problem Relation Age of Onset  . Hypertension Father   . Heart disease Father     CABG, late 47's  . Hyperlipidemia Father   . Cancer Maternal Grandmother     lung cancer, esophageal  . Esophageal cancer Maternal Grandmother   . Colon polyps Maternal Grandfather   . Colon polyps Mother   . Diabetes Paternal Grandmother   . Colon cancer Neg Hx   . Rectal cancer Neg Hx   . Stomach cancer Neg Hx     Past Surgical History:  Procedure Laterality Date  . APPENDECTOMY    . BLADDER SUSPENSION  2004  . DIAGNOSTIC LAPAROSCOPY    . DILATION AND  CURETTAGE OF UTERUS    . ESOPHAGOGASTRODUODENOSCOPY  12/15/12   [place], Dr. Fuller Plan  . LAPAROSCOPIC ASSISTED VAGINAL HYSTERECTOMY  03/10/2012   Procedure: LAPAROSCOPIC ASSISTED VAGINAL HYSTERECTOMY;  Surgeon: Gus Height, MD;  Location: [facility];  Service: Gynecology;  Laterality: N/A;  . LAPAROSCOPY  12/19/2011   Procedure: LAPAROSCOPY OPERATIVE;  Surgeon: Gus Height, MD;  Location: [facility];  Service: Gynecology;  Laterality: N/A;  . SALPINGOOPHORECTOMY  03/10/2012   Procedure: SALPINGO OOPHORECTOMY;  Surgeon: Gus Height, MD;  Location: [facility];  Service: Gynecology;  Laterality: Bilateral;  . URETHRAL SLING  2012   SLING MATERIAL REMOVED   Social History   Occupational History  . homemaker    Social History Main Topics  .  Smoking status: Never Smoker  . Smokeless tobacco: Never Used  . Alcohol use 4.2 oz/week    7 Glasses of wine per week     Comment: cut back from daily use; now 2-3 times/week  . Drug use: No  . Sexual activity: Not on file     Comment: husband vasectomy/hysterectomy

## 2016-08-26 ENCOUNTER — Encounter (INDEPENDENT_AMBULATORY_CARE_PROVIDER_SITE_OTHER): Payer: Self-pay | Admitting: Physician Assistant

## 2016-08-26 ENCOUNTER — Ambulatory Visit (INDEPENDENT_AMBULATORY_CARE_PROVIDER_SITE_OTHER): Payer: Self-pay | Admitting: Physician Assistant

## 2016-08-26 DIAGNOSIS — M65311 Trigger thumb, right thumb: Secondary | ICD-10-CM

## 2016-08-26 MED ORDER — METHYLPREDNISOLONE ACETATE 40 MG/ML IJ SUSP
20.0000 mg | INTRAMUSCULAR | Status: AC | PRN
  Administered 2016-08-26: 20 mg

## 2016-08-26 MED ORDER — LIDOCAINE HCL 1 % IJ SOLN
0.5000 mL | INTRAMUSCULAR | Status: AC | PRN
  Administered 2016-08-26: .5 mL

## 2016-08-26 NOTE — Progress Notes (Signed)
   Procedure Note  Patient: Robin Parks             Date of Birth: 06/03/1970           MRN: [id number]             Visit Date: 08/26/2016  Procedures: Visit Diagnoses: Trigger thumb, right thumb  History of present illness Robin Parks returns today for recurrence of her right trigger thumb. She's had no new injury. She states the injection back in February helped until just recently over the last couple weeks. She is taking ibuprofen as needed. She is requesting another injection in the right thumb.  Physical exam:    Right thumb actively triggering. Tenderness over the A1 pulley. Hand/UE Inj Date/Time: 08/26/2016 10:23 AM Performed by: Pete Pelt Authorized by: Pete Pelt   Consent Given by:  Patient Indications:  Tendon swelling Condition: trigger finger   Location:  Thumb Site:  R thumb A1 Needle Size:  25 G Approach:  Volar Ultrasound Guidance: No   Medications:  0.5 mL lidocaine 1 %; 20 mg methylPREDNISolone acetate 40 MG/ML Patient tolerance:  Patient tolerated the procedure well with no immediate complications    Plan:Follow up if trigger thumb reoccurs. Otherwise as needed.

## 2017-12-26 ENCOUNTER — Telehealth: Payer: Self-pay | Admitting: Gastroenterology

## 2017-12-26 NOTE — Telephone Encounter (Signed)
Patient has rescheduled her jury duty, but the court needs verification she has an appt. Letter faxed to her at [phone]

## 2018-01-01 ENCOUNTER — Encounter: Payer: Self-pay | Admitting: Gastroenterology

## 2018-01-01 ENCOUNTER — Ambulatory Visit: Payer: Self-pay | Admitting: Gastroenterology

## 2018-01-01 VITALS — BP 134/78 | HR 70 | Ht 64.5 in | Wt 167.2 lb

## 2018-01-01 DIAGNOSIS — R1310 Dysphagia, unspecified: Secondary | ICD-10-CM

## 2018-01-01 DIAGNOSIS — R1319 Other dysphagia: Secondary | ICD-10-CM

## 2018-01-01 DIAGNOSIS — K219 Gastro-esophageal reflux disease without esophagitis: Secondary | ICD-10-CM

## 2018-01-01 DIAGNOSIS — Z86010 Personal history of colonic polyps: Secondary | ICD-10-CM

## 2018-01-01 NOTE — Progress Notes (Signed)
History of Present Illness: This is a 47 year old female self referred for the evaluation of reflux symptoms and dysphagia.  She is accompanied by her husband.  She has a history of GERD with an esophageal stricture.  See EGD below.  She relates that was taking Nexium OTC intermittently with very good control of intermittent reflux symptoms.  Her father advised her to discontinue Nexium due to concerns about potential side effects which she did.  Since then her symptoms have worsened with frequent heartburn, regurgitation and belching.  In addition she has intermittent solid food dysphagia which has occasionally led to vomiting.  She is eating softer foods and smaller pieces of meat to avoid dysphagia.  Denies weight loss, abdominal pain, constipation, diarrhea, change in stool caliber, melena, hematochezia, chest pain.    EGD 12/2012 Stricture at [facility] dilated. Colonoscopy 05/2013 one polyp, serrated sessile polyp  Allergies  Allergen Reactions  . Codeine Shortness Of Breath    Panic attacks, heart palpitations  . Oxycodone Shortness Of Breath    Panic attacks, heart palpitations Vicodin OK   Outpatient Medications Prior to Visit  Medication Sig Dispense Refill  . Biotin w/ Vitamins C & E (HAIR SKIN & NAILS GUMMIES PO) Take 2,500 mcg by mouth daily.    . Calcium-Phosphorus-Vitamin D ([place]) [phone] MG-MG-UNIT CHEW Chew by mouth daily.    . Diethylpropion HCl CR 75 MG TB24 Take by mouth daily.    Marland Kitchen ibuprofen (ADVIL,MOTRIN) 200 MG tablet Take 200 mg by mouth every 6 (six) hours as needed for pain.    Marland Kitchen loratadine (KLS ALLERCLEAR) 10 MG tablet Take 10 mg by mouth daily.    Arnette Schaumann [facility]) Place vaginally at bedtime.    . Alpha-D-Galactosidase (BEANO) TABS Take 1 tablet by mouth as needed.    . ALPRAZolam (XANAX) 0.5 MG tablet Take 0.5 mg by mouth at bedtime.    . Biotin 1 MG CAPS Take 1 each by mouth.    . Calcium Carbonate-Vitamin D (CALCIUM 600+D)  600-400 MG-UNIT per tablet Take 1 tablet by mouth daily.    . Digestive Enzymes (DIGESTIVE ENZYME PO) Take 1 capsule by mouth daily.    Marland Kitchen esomeprazole (NEXIUM) 40 MG capsule Take 20 mg by mouth daily.    Marland Kitchen estradiol (ESTRACE) 1 MG tablet     . hyoscyamine (LEVSIN SL) 0.125 MG SL tablet Take 1-2 tablets by mouth four times a day as needed (Patient not taking: Reported on 05/16/2016) 100 tablet 5  . lactase (LACTAID) 3000 UNITS tablet Take 1 tablet by mouth 3 (three) times daily with meals.     No facility-administered medications prior to visit.    Past Medical History:  Diagnosis Date  . Anxiety    PANIC ATTACKS  . Chronic lower back pain    no meds  . Endometriosis   . Esophageal stricture   . GERD (gastroesophageal reflux disease)   . PONV (postoperative nausea and vomiting)   . SVD (spontaneous vaginal delivery)    x 2   Past Surgical History:  Procedure Laterality Date  . APPENDECTOMY    . BLADDER SUSPENSION  2004  . DIAGNOSTIC LAPAROSCOPY    . DILATION AND CURETTAGE OF UTERUS    . ESOPHAGOGASTRODUODENOSCOPY  12/15/12   [place], Dr. Fuller Plan  . LAPAROSCOPIC ASSISTED VAGINAL HYSTERECTOMY  03/10/2012   Procedure: LAPAROSCOPIC ASSISTED VAGINAL HYSTERECTOMY;  Surgeon: Gus Height, MD;  Location: [facility];  Service: Gynecology;  Laterality: N/A;  .  LAPAROSCOPY  12/19/2011   Procedure: LAPAROSCOPY OPERATIVE;  Surgeon: Gus Height, MD;  Location: [facility];  Service: Gynecology;  Laterality: N/A;  . SALPINGOOPHORECTOMY  03/10/2012   Procedure: SALPINGO OOPHORECTOMY;  Surgeon: Gus Height, MD;  Location: [facility];  Service: Gynecology;  Laterality: Bilateral;  . URETHRAL SLING  2012   SLING MATERIAL REMOVED   Social History   Socioeconomic History  . Marital status: Married    Spouse name: Not on file  . Number of children: 2  . Years of education: Not on file  . Highest education level: Not on file  Occupational History  . Occupation: homemaker  Social Needs  . Financial  resource strain: Not on file  . Food insecurity:    Worry: Not on file    Inability: Not on file  . Transportation needs:    Medical: Not on file    Non-medical: Not on file  Tobacco Use  . Smoking status: Never Smoker  . Smokeless tobacco: Never Used  Substance and Sexual Activity  . Alcohol use: Yes    Alcohol/week: 7.0 standard drinks    Types: 7 Glasses of wine per week    Comment: cut back from daily use; now 2-3 times/week  . Drug use: No  . Sexual activity: Yes    Partners: Male    Birth control/protection: None    Comment: husband vasectomy/hysterectomy  Lifestyle  . Physical activity:    Days per week: Not on file    Minutes per session: Not on file  . Stress: Not on file  Relationships  . Social connections:    Talks on phone: Not on file    Gets together: Not on file    Attends religious service: Not on file    Active member of club or organization: Not on file    Attends meetings of clubs or organizations: Not on file    Relationship status: Not on file  Other Topics Concern  . Not on file  Social History Narrative   Lives with husband, son Arbutus Ped), daughter, Harmon Pier), 2 dogs, bearded dragon.  Daughter plays soccer Engineer, production).  Works part time at [employer] (1-2x/week).   Family History  Problem Relation Age of Onset  . Hypertension Father   . Heart disease Father        CABG, late 33's  . Hyperlipidemia Father   . Colon polyps Mother   . Cancer Maternal Grandmother        lung cancer, esophageal  . Esophageal cancer Maternal Grandmother   . Colon polyps Maternal Grandfather   . Diabetes Paternal Grandmother   . Colon cancer Neg Hx   . Rectal cancer Neg Hx   . Stomach cancer Neg Hx        Review of Systems: Pertinent positive and negative review of systems were noted in the above HPI section. All other review of systems were otherwise negative.    Physical Exam: General: Well developed, well nourished, no acute distress Head: Normocephalic  and atraumatic Eyes:  sclerae anicteric, EOMI Ears: Normal auditory acuity Mouth: No deformity or lesions Neck: Supple, no masses or thyromegaly Lungs: Clear throughout to auscultation Heart: Regular rate and rhythm; no murmurs, rubs or bruits Abdomen: Soft, non tender and non distended. No masses, hepatosplenomegaly or hernias noted. Normal Bowel sounds Rectal: Not done Musculoskeletal: Symmetrical with no gross deformities  Skin: No lesions on visible extremities Pulses:  Normal pulses noted Extremities: No clubbing, cyanosis, edema or deformities noted Neurological: Alert oriented  x 4, grossly nonfocal Cervical Nodes:  No significant cervical adenopathy Inguinal Nodes: No significant inguinal adenopathy Psychological:  Alert and cooperative. Anxious.    Assessment and Recommendations:  1. GERD and solid food dysphagia.  Suspected esophageal stricture and active GERD symptoms.  Rule out esophagitis.  Recommended she follow antireflux measures and resume Nexium 20 mg po qd for 6 to 8 weeks (not prn).  We discussed the efficacy and safety profile of PPIs.  Offered to schedule with EGD and possible dilation for further evaluation or a daily PPI usage for 6 to 8 weeks and assess response.  If any symptoms persist EGD will be necessary.  She would like to wait to see if EGD is needed.  REV in 6-8 weeks.   2.  Personal history of a serrated sessile polyp in February 2015.  A 5-year interval surveillance colonoscopy is recommended in February 2020.

## 2018-01-01 NOTE — Patient Instructions (Signed)
Take over the counter Nexium 20 mg daily. We have given you samples to get you started.  Patient advised to avoid spicy, acidic, citrus, chocolate, mints, fruit and fruit juices.  Limit the intake of caffeine, alcohol and Soda.  Don't exercise too soon after eating.  Don't lie down within 3-4 hours of eating.  Elevate the head of your bed.  Call back in between the 6 weeks if your symptoms have not improved.   Thank you for choosing me and [facility].  Pricilla Riffle. Dagoberto Ligas., MD., [facility]

## 2018-02-16 ENCOUNTER — Ambulatory Visit: Payer: No Typology Code available for payment source | Admitting: Gastroenterology

## 2018-03-25 ENCOUNTER — Ambulatory Visit: Payer: Self-pay | Admitting: Gastroenterology

## 2018-03-25 ENCOUNTER — Encounter: Payer: Self-pay | Admitting: Gastroenterology

## 2018-03-25 VITALS — BP 134/74 | HR 68 | Ht 64.5 in | Wt 168.5 lb

## 2018-03-25 DIAGNOSIS — K219 Gastro-esophageal reflux disease without esophagitis: Secondary | ICD-10-CM

## 2018-03-25 DIAGNOSIS — Z86010 Personal history of colonic polyps: Secondary | ICD-10-CM

## 2018-03-25 DIAGNOSIS — R142 Eructation: Secondary | ICD-10-CM

## 2018-03-25 NOTE — Progress Notes (Signed)
    History of Present Illness: This is a 47 year old female with GERD. She is accompanied by her husband. Previously had dysphagia and reflux symptoms.  Following antireflux measures and taking Nexium on a daily basis GERD symptoms and dysphagia have completely resolved.  Her only complaint is frequent belching and upper abdominal gas.   Current Medications, Allergies, Past Medical History, Past Surgical History, Family History and Social History were reviewed in [employer] record.  Physical Exam: General: Well developed, well nourished, no acute distress Head: Normocephalic and atraumatic Eyes:  sclerae anicteric, EOMI Ears: Normal auditory acuity Neurological: Alert oriented x 4, grossly nonfocal Psychological:  Alert and cooperative. Normal mood and affect   Assessment and Recommendations:  1.  Gas, belching.  Follow a low gas diet.  Gaviscon 3 times daily as needed and/or Gas-X 3 times daily as needed.  2.  GERD with a history of esophageal stricture.  Follow standard antireflux measures and continue Nexium 20 mg every morning long-term.  3.  Personal history of a serrated sessile colon polyps. Surveillance colonoscopy recommended in February 2020.  I spent 15 minutes of face-to-face time with the patient. Greater than 50% of the time was spent counseling and coordinating care.

## 2018-03-25 NOTE — Patient Instructions (Signed)
Stay on Nexium daily.   You can take over the counter Gaviscon or Gas-x three times a day for gas and bloating.   You have been given a gas prevention diet.   Thank you for choosing me and [facility].  Pricilla Riffle. Dagoberto Ligas., MD., [facility]

## 2018-05-19 ENCOUNTER — Encounter: Payer: Self-pay | Admitting: Gastroenterology

## 2018-06-06 ENCOUNTER — Encounter: Payer: Self-pay | Admitting: Gastroenterology

## 2019-06-28 ENCOUNTER — Other Ambulatory Visit: Payer: Self-pay

## 2019-06-28 ENCOUNTER — Ambulatory Visit (INDEPENDENT_AMBULATORY_CARE_PROVIDER_SITE_OTHER): Payer: Self-pay

## 2019-06-28 ENCOUNTER — Ambulatory Visit (INDEPENDENT_AMBULATORY_CARE_PROVIDER_SITE_OTHER): Payer: Self-pay | Admitting: Physician Assistant

## 2019-06-28 ENCOUNTER — Encounter: Payer: Self-pay | Admitting: Physician Assistant

## 2019-06-28 DIAGNOSIS — M79645 Pain in left finger(s): Secondary | ICD-10-CM

## 2019-06-28 DIAGNOSIS — M65312 Trigger thumb, left thumb: Secondary | ICD-10-CM

## 2019-06-28 MED ORDER — LIDOCAINE HCL 1 % IJ SOLN
0.5000 mL | INTRAMUSCULAR | Status: AC | PRN
  Administered 2019-06-28: .5 mL

## 2019-06-28 MED ORDER — METHYLPREDNISOLONE ACETATE 40 MG/ML IJ SUSP
20.0000 mg | INTRAMUSCULAR | Status: AC | PRN
  Administered 2019-06-28: 20 mg

## 2019-06-28 NOTE — Progress Notes (Signed)
Office Visit Note   Patient: Robin Parks           Date of Birth: [birth year]           MRN: [id number] Visit Date: 06/28/2019              Requested by: Robin Parks, [address] [place],  [place] [address] PCP: Robin Queen, MD   Assessment & Parks: Visit Diagnoses:  1. Pain of left thumb   2. Trigger thumb, left thumb     Parks: She will follow-up with [place] on an as-needed basis.  Questions encouraged and answered.  Follow-Up Instructions: Return if symptoms worsen or fail to improve.   Orders:  Orders Placed This Encounter  Procedures  . XR Finger Thumb Left   No orders of the defined types were placed in this encounter.     Procedures: Hand/UE Inj: L thumb A1 for trigger finger on 06/28/2019 11:23 AM Medications: 0.5 mL lidocaine 1 %; 20 mg methylPREDNISolone acetate 40 MG/ML      Clinical Data: No additional findings.   Subjective: Chief Complaint  Patient presents with  . Left Thumb - Pain    HPI Ms. Robin Parks well-known to Dr. Ninfa Parks service last saw her in 2018 for a right trigger thumb.  She states the right thumb is doing very well.  She has had no injury to her left thumb but now it is beginning to lock up.  States this started around Christmas of 2020.  She has had no known injury to the thumb.  She is nondiabetic. Review of Systems  Negative for fevers chills shortness of breath chest pain Objective: Vital Signs: LMP 01/21/2012   Physical Exam General: Well-developed well-nourished female no acute distress. Psych: Alert and oriented x3 Ortho Exam Left hand left thumb active triggering.  Left thumb tenderness over the A1 pulley where there is a palpable nodule.  Maintain hand is neurovascular intact.  Specialty Comments:  No specialty comments available.  Imaging: XR Finger Thumb Left  Result Date: 06/28/2019 Left thumb 3 views: No acute fractures.  No bony abnormalities.  No significant arthritic changes.  No  subluxation dislocation.    PMFS History: Patient Active Problem List   Diagnosis Date Noted  . Abdominal pain, generalized 11/04/2012  . Back pain, thoracic 06/24/2012  . Abdominal pain, RUQ 06/24/2012  . Anxiety state, unspecified 06/12/2012  . GERD (gastroesophageal reflux disease) 06/12/2012   Past Medical History:  Diagnosis Date  . Anxiety    PANIC ATTACKS  . Chronic lower back pain    no meds  . Colon polyps    sessile serrated  . Endometriosis   . Esophageal stricture   . GERD (gastroesophageal reflux disease)   . PONV (postoperative nausea and vomiting)   . SVD (spontaneous vaginal delivery)    x 2    Family History  Problem Relation Age of Onset  . Hypertension Father   . Heart disease Father        CABG, late 12's  . Hyperlipidemia Father   . Colon polyps Mother   . Cancer Maternal Grandmother        lung cancer, esophageal  . Esophageal cancer Maternal Grandmother   . Colon polyps Maternal Grandfather   . Diabetes Paternal Grandmother   . Colon cancer Neg Hx   . Rectal cancer Neg Hx   . Stomach cancer Neg Hx     Past Surgical History:  Procedure Laterality Date  .  APPENDECTOMY    . BLADDER SUSPENSION  2004  . DIAGNOSTIC LAPAROSCOPY    . DILATION AND CURETTAGE OF UTERUS    . ESOPHAGOGASTRODUODENOSCOPY  12/15/12   [place], Dr. Fuller Parks  . LAPAROSCOPIC ASSISTED VAGINAL HYSTERECTOMY  03/10/2012   Procedure: LAPAROSCOPIC ASSISTED VAGINAL HYSTERECTOMY;  Surgeon: Robin Height, MD;  Location: [facility];  Service: Gynecology;  Laterality: N/A;  . LAPAROSCOPY  12/19/2011   Procedure: LAPAROSCOPY OPERATIVE;  Surgeon: Robin Height, MD;  Location: [facility];  Service: Gynecology;  Laterality: N/A;  . SALPINGOOPHORECTOMY  03/10/2012   Procedure: SALPINGO OOPHORECTOMY;  Surgeon: Robin Height, MD;  Location: [facility];  Service: Gynecology;  Laterality: Bilateral;  . URETHRAL SLING  2012   SLING MATERIAL REMOVED   Social History   Occupational History  .  Occupation: homemaker  Tobacco Use  . Smoking status: Never Smoker  . Smokeless tobacco: Never Used  Substance and Sexual Activity  . Alcohol use: Yes    Alcohol/week: 7.0 standard drinks    Types: 7 Glasses of wine per week    Comment: cut back from daily use; now 2-3 times/week  . Drug use: No  . Sexual activity: Yes    Partners: Male    Birth control/protection: None    Comment: husband vasectomy/hysterectomy
